# Patient Record
Sex: Female | Born: 1990 | Race: White | Hispanic: No | Marital: Single | Smoking: Current every day smoker
Health system: Southern US, Community
[De-identification: ages and names within clinical notes are randomized; demographics above are authoritative.]

## PROBLEM LIST (undated history)

## (undated) ENCOUNTER — Inpatient Hospital Stay (HOSPITAL_COMMUNITY): Payer: Self-pay

## (undated) DIAGNOSIS — B9689 Other specified bacterial agents as the cause of diseases classified elsewhere: Secondary | ICD-10-CM

## (undated) DIAGNOSIS — D649 Anemia, unspecified: Secondary | ICD-10-CM

## (undated) DIAGNOSIS — Z789 Other specified health status: Secondary | ICD-10-CM

## (undated) DIAGNOSIS — I10 Essential (primary) hypertension: Secondary | ICD-10-CM

## (undated) DIAGNOSIS — O219 Vomiting of pregnancy, unspecified: Secondary | ICD-10-CM

## (undated) DIAGNOSIS — F419 Anxiety disorder, unspecified: Secondary | ICD-10-CM

## (undated) DIAGNOSIS — R519 Headache, unspecified: Secondary | ICD-10-CM

## (undated) DIAGNOSIS — R51 Headache: Secondary | ICD-10-CM

## (undated) DIAGNOSIS — O139 Gestational [pregnancy-induced] hypertension without significant proteinuria, unspecified trimester: Secondary | ICD-10-CM

## (undated) DIAGNOSIS — N760 Acute vaginitis: Secondary | ICD-10-CM

## (undated) HISTORY — PX: NO PAST SURGERIES: SHX2092

## (undated) HISTORY — DX: Vomiting of pregnancy, unspecified: O21.9

---
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
## 2011-10-06 ENCOUNTER — Emergency Department (HOSPITAL_COMMUNITY)
Admission: EM | Admit: 2011-10-06 | Discharge: 2011-10-06 | Disposition: A | Discharge status: Home / Self Care | Payer: Self-pay | Attending: Emergency Medicine | Admitting: Emergency Medicine

## 2011-10-06 ENCOUNTER — Encounter (HOSPITAL_COMMUNITY): Payer: Self-pay

## 2011-10-06 ENCOUNTER — Emergency Department (HOSPITAL_COMMUNITY): Payer: Self-pay

## 2011-10-06 DIAGNOSIS — F17200 Nicotine dependence, unspecified, uncomplicated: Secondary | ICD-10-CM | POA: Insufficient documentation

## 2011-10-06 DIAGNOSIS — S86819A Strain of other muscle(s) and tendon(s) at lower leg level, unspecified leg, initial encounter: Secondary | ICD-10-CM | POA: Insufficient documentation

## 2011-10-06 DIAGNOSIS — M25569 Pain in unspecified knee: Secondary | ICD-10-CM | POA: Insufficient documentation

## 2011-10-06 DIAGNOSIS — X58XXXA Exposure to other specified factors, initial encounter: Secondary | ICD-10-CM | POA: Insufficient documentation

## 2011-10-06 DIAGNOSIS — S838X9A Sprain of other specified parts of unspecified knee, initial encounter: Secondary | ICD-10-CM | POA: Insufficient documentation

## 2011-10-06 DIAGNOSIS — S8392XA Sprain of unspecified site of left knee, initial encounter: Secondary | ICD-10-CM

## 2011-10-06 MED ORDER — IBUPROFEN 800 MG PO TABS
800.0000 mg | ORAL_TABLET | Freq: Once | ORAL | Status: AC
  Administered 2011-10-06: 800 mg via ORAL
  Filled 2011-10-06: qty 1

## 2011-10-06 NOTE — ED Notes (Signed)
Left knee pain for 2 months, pain worse today--was out dancing last night

## 2011-10-06 NOTE — ED Provider Notes (Signed)
History     CSN: [id number]  Arrival date & time 10/06/11  1323   First MD Initiated Contact with Patient 10/06/11 1349      Chief Complaint  Patient presents with  . Knee Pain    (Consider location/radiation/quality/duration/timing/severity/associated sxs/prior treatment) HPI Comments: Pt states her L knee has been hurting ~ 2 months.  She denies any injury and has no known problem with the knee.  She went dancing last PM and twisted the knee.  It is eeven more painful today than usual.  Patient is a 21 y.o. female presenting with knee pain. The history is provided by the patient. No language interpreter was used.  Knee Pain This is a new problem. The problem occurs constantly. Pertinent negatives include no joint swelling, numbness or weakness. The symptoms are aggravated by walking and standing. She has tried nothing for the symptoms.    History reviewed. No pertinent past medical history.  History reviewed. No pertinent past surgical history.  No family history on file.  History  Substance Use Topics  . Smoking status: Current Everyday Smoker    Types: Cigarettes  . Smokeless tobacco: Not on file  . Alcohol Use: No    OB History    Grav Para Term Preterm Abortions TAB SAB Ect Mult Living                  Review of Systems  Musculoskeletal: Negative for joint swelling and gait problem.  Skin: Negative for wound.  Neurological: Negative for weakness and numbness.  All other systems reviewed and are negative.    Allergies  Penicillins  Home Medications   Current Outpatient Rx  Name Route Sig Dispense Refill  . MEDROXYPROGESTERONE ACETATE 150 MG/ML IM SUSP Intramuscular Inject 150 mg into the muscle every 3 (three) months.    Marland Kitchen NAPROXEN SODIUM 220 MG PO TABS Oral Take 220 mg by mouth 2 (two) times daily as needed. Pain      BP 126/71  Pulse 87  Temp 98.3 F (36.8 C) (Oral)  Resp 20  Ht 5' 9.5" (1.765 m)  Wt 176 lb (79.833 kg)  BMI 25.62 kg/m2  SpO2  99%  Physical Exam  Nursing note and vitals reviewed. Constitutional: She is oriented to person, place, and time. She appears well-developed and well-nourished. No distress.  HENT:  Head: Normocephalic and atraumatic.  Eyes: EOM are normal.  Neck: Normal range of motion.  Cardiovascular: Normal rate, regular rhythm and normal heart sounds.   Pulmonary/Chest: Effort normal and breath sounds normal.  Abdominal: Soft. She exhibits no distension. There is no tenderness.  Musculoskeletal: She exhibits tenderness.       Left knee: She exhibits decreased range of motion. She exhibits no swelling, no effusion, no ecchymosis, no deformity, no laceration, no erythema, normal alignment and no LCL laxity. tenderness found. Lateral joint line tenderness noted.       Pain with passive movement of patella.  Lateral joint line pain with both valgus and varus stress.  Anterior drawer and lachmann's tests are negative.  Neurological: She is alert and oriented to person, place, and time.  Skin: Skin is warm and dry.  Psychiatric: She has a normal mood and affect. Judgment normal.    ED Course  Procedures (including critical care time)  Labs Reviewed - No data to display Dg Knee Complete 4 Views Left  10/06/2011  *RADIOLOGY REPORT*  Clinical Data: Twisting injury and knee.  Lateral knee pain.  LEFT KNEE - COMPLETE  4+ VIEW  Comparison:  None.  Findings:  There is no evidence of fracture, dislocation, or joint effusion.  There is no evidence of arthropathy or other focal bone abnormality.  Soft tissues are unremarkable.  IMPRESSION: Negative.  Original Report Authenticated By: Danae Orleans, M.D.     1. Left knee sprain       MDM  Knee immobilizer, crutches, ice Ibuprofen 800 mg TID F/u with dr. Hilda Lias prn         Evalina Field, PA 10/06/11 1459

## 2011-10-06 NOTE — ED Provider Notes (Signed)
Medical screening examination/treatment/procedure(s) were performed by non-physician practitioner and as supervising physician I was immediately available for consultation/collaboration.   Benny Lennert, MD 10/06/11 [phone]

## 2011-10-06 NOTE — ED Notes (Signed)
Patient with no complaints at this time. Respirations even and unlabored. Skin warm/dry. Discharge instructions reviewed with patient at this time. Patient given opportunity to voice concerns/ask questions. Patient discharged at this time and left Emergency Department with steady gait.   

## 2014-02-06 ENCOUNTER — Encounter (HOSPITAL_COMMUNITY): Payer: Self-pay | Admitting: Emergency Medicine

## 2014-02-06 ENCOUNTER — Emergency Department (HOSPITAL_COMMUNITY)
Admission: EM | Admit: 2014-02-06 | Discharge: 2014-02-06 | Disposition: A | Discharge status: Home / Self Care | Payer: Self-pay | Attending: Emergency Medicine | Admitting: Emergency Medicine

## 2014-02-06 DIAGNOSIS — Z720 Tobacco use: Secondary | ICD-10-CM | POA: Insufficient documentation

## 2014-02-06 DIAGNOSIS — Z880 Allergy status to penicillin: Secondary | ICD-10-CM | POA: Insufficient documentation

## 2014-02-06 DIAGNOSIS — Z3202 Encounter for pregnancy test, result negative: Secondary | ICD-10-CM | POA: Insufficient documentation

## 2014-02-06 DIAGNOSIS — Z79818 Long term (current) use of other agents affecting estrogen receptors and estrogen levels: Secondary | ICD-10-CM | POA: Insufficient documentation

## 2014-02-06 DIAGNOSIS — M545 Low back pain: Secondary | ICD-10-CM | POA: Insufficient documentation

## 2014-02-06 DIAGNOSIS — N939 Abnormal uterine and vaginal bleeding, unspecified: Secondary | ICD-10-CM | POA: Insufficient documentation

## 2014-02-06 LAB — URINALYSIS, ROUTINE W REFLEX MICROSCOPIC
Glucose, UA: NEGATIVE mg/dL
Ketones, ur: NEGATIVE mg/dL
Leukocytes, UA: NEGATIVE
NITRITE: NEGATIVE
PROTEIN: 30 mg/dL — AB
Specific Gravity, Urine: >1.03 — ABNORMAL HIGH (ref 1.005–1.030)
UROBILINOGEN UA: 1 mg/dL (ref 0.0–1.0)
pH: 6.5 (ref 5.0–8.0)

## 2014-02-06 LAB — POC URINE PREG, ED: PREG TEST UR: NEGATIVE

## 2014-02-06 LAB — URINE MICROSCOPIC-ADD ON

## 2014-02-06 MED ORDER — IBUPROFEN 800 MG PO TABS
800.0000 mg | ORAL_TABLET | Freq: Once | ORAL | Status: AC
  Administered 2014-02-06: 800 mg via ORAL
  Filled 2014-02-06: qty 1

## 2014-02-06 MED ORDER — IBUPROFEN 800 MG PO TABS: 800.0000 mg | ORAL_TABLET | Freq: Three times a day (TID) | ORAL | Status: DC

## 2014-02-06 NOTE — ED Provider Notes (Signed)
CSN: [id number]     Arrival date & time 02/06/14  1704 History  This patient was seen in room APFT24/APFT24 and the patient's care was started at 5:43 PM.    Chief Complaint  Patient presents with  . Vaginal Bleeding     HPI   HPI Comments: Dana Lam is a 23 y.o. female who presents to the Emergency Department complaining of vaginal bleeding that began this morning.  She states that she is approximately two weeks late on her period and she is concerned that shy may be pregnant.  She reports starting her menstrual period this morning with a lighter than normal period today and mainly just "spotting".  She also reports "aching" pain to her lower back.  She denies any abdominal pain, fever, vomiting, dysuria, flank pain, or vaginal discharge.  She states she has taken several home pregnancies tests which have all been negative.     History reviewed. No pertinent past medical history. History reviewed. No pertinent past surgical history. Family History  Problem Relation Age of Onset  . Hypertension Mother   . Cancer Other   . Heart attack Maternal Grandfather   . Heart attack Maternal Uncle    History  Substance Use Topics  . Smoking status: Current Every Day Smoker -- 0.07 packs/day for 5 years    Types: Cigarettes  . Smokeless tobacco: Never Used  . Alcohol Use: No     Comment: occasional   OB History    No data available     Review of Systems  Constitutional: Negative for fever, chills, activity change and appetite change.  Respiratory: Negative for cough, chest tightness and shortness of breath.   Cardiovascular: Negative for chest pain.  Gastrointestinal: Negative for nausea, vomiting and abdominal pain.  Genitourinary: Positive for vaginal bleeding and menstrual problem. Negative for dysuria, frequency, hematuria, flank pain, vaginal discharge, difficulty urinating and vaginal pain.  Musculoskeletal: Positive for back pain.  Skin: Negative for color change and  rash.  Neurological: Negative for dizziness, weakness and numbness.  Hematological: Negative for adenopathy.  All other systems reviewed and are negative.     Allergies  Penicillins  Home Medications   Prior to Admission medications   Medication Sig Start Date End Date Taking? Authorizing Provider  medroxyPROGESTERone (DEPO-PROVERA) 150 MG/ML injection Inject 150 mg into the muscle every 3 (three) months.    Historical Provider, MD  naproxen sodium (ALEVE) 220 MG tablet Take 220 mg by mouth 2 (two) times daily as needed. Pain    Historical Provider, MD   BP 131/70 mmHg  Pulse 94  Temp(Src) 97.9 F (36.6 C) (Oral)  Resp 16  Ht 5\' 10"  (1.778 m)  Wt 178 lb 9.6 oz (81.012 kg)  BMI 25.63 kg/m2  SpO2 100%  LMP 01/01/2014   Physical Exam  Constitutional: She is oriented to person, place, and time. She appears well-developed and well-nourished.  HENT:  Head: Normocephalic and atraumatic.  Cardiovascular: Normal rate, regular rhythm, normal heart sounds and intact distal pulses.   No murmur heard. Pulmonary/Chest: Effort normal. No respiratory distress.  Abdominal: Soft. She exhibits no distension and no mass. There is no tenderness. There is no rebound and no guarding.  Musculoskeletal: Normal range of motion.  Neurological: She is alert and oriented to person, place, and time. She exhibits normal muscle tone. Coordination normal.  Skin: Skin is warm and dry.  Psychiatric: She has a normal mood and affect.  Nursing note and vitals reviewed.   ED  Course  Procedures (including critical care time)  DIAGNOSTIC STUDIES: Oxygen Saturation is 100% on RA, normal by my interpretation.    COORDINATION OF CARE: 5:43 PM Discussed treatment plan with pt at bedside and pt agreed to plan.   Labs Review Labs Reviewed  URINALYSIS, ROUTINE W REFLEX MICROSCOPIC - Abnormal; Notable for the following:    Color, Urine AMBER (*)    APPearance TURBID (*)    Specific Gravity, Urine >1.030  (*)    Hgb urine dipstick LARGE (*)    Bilirubin Urine SMALL (*)    Protein, ur 30 (*)    All other components within normal limits  URINE MICROSCOPIC-ADD ON  POC URINE PREG, ED    Imaging Review No results found.   EKG Interpretation None      MDM   Final diagnoses:  Vaginal bleeding    Pt is well appearing.  Ambulates with steady gait.  No abdominal pain on exam, no CVA tenderness.  Urine preg is negative.  [facility], appears stable for d/c and agrees to return here for any worsening symptoms      Jeanine Caven L. Trisha Mangleriplett, PA-C 02/07/14 0015  Donnetta HutchingBrian Cook, MD 02/10/14 2136

## 2014-02-06 NOTE — ED Notes (Signed)
Patient reports vaginal bleeding that started today. Denies any pain. Per patient bleeding is heavier then spotting but not like a normal period. Patient reports period is two weeks late.

## 2014-04-05 ENCOUNTER — Other Ambulatory Visit (INDEPENDENT_AMBULATORY_CARE_PROVIDER_SITE_OTHER): Payer: Medicaid Other

## 2014-04-05 ENCOUNTER — Encounter: Payer: Self-pay | Admitting: Advanced Practice Midwife

## 2014-04-05 ENCOUNTER — Ambulatory Visit (INDEPENDENT_AMBULATORY_CARE_PROVIDER_SITE_OTHER): Payer: Medicaid Other | Admitting: Advanced Practice Midwife

## 2014-04-05 ENCOUNTER — Other Ambulatory Visit: Payer: Self-pay | Admitting: Women's Health

## 2014-04-05 ENCOUNTER — Ambulatory Visit: Payer: Self-pay | Admitting: Women's Health

## 2014-04-05 DIAGNOSIS — Z331 Pregnant state, incidental: Secondary | ICD-10-CM

## 2014-04-05 DIAGNOSIS — O4691 Antepartum hemorrhage, unspecified, first trimester: Secondary | ICD-10-CM

## 2014-04-05 DIAGNOSIS — O209 Hemorrhage in early pregnancy, unspecified: Secondary | ICD-10-CM

## 2014-04-05 DIAGNOSIS — Z1389 Encounter for screening for other disorder: Secondary | ICD-10-CM

## 2014-04-05 DIAGNOSIS — O200 Threatened abortion: Secondary | ICD-10-CM | POA: Insufficient documentation

## 2014-04-05 LAB — POCT URINALYSIS DIPSTICK
Glucose, UA: NEGATIVE
KETONES UA: NEGATIVE
Leukocytes, UA: NEGATIVE
Nitrite, UA: NEGATIVE
PROTEIN UA: NEGATIVE
RBC UA: 3

## 2014-04-05 NOTE — Progress Notes (Signed)
Abdominal & Vaginal U/S performed-single Intrauterine GS noted within lower uterine segment with ?YS noted within, GS has an irregular shape, cx appears closed, bilateral adnexa appears WNL, GS meas c/w 6+0 wks no definite fetal pole noted within

## 2014-04-05 NOTE — Progress Notes (Signed)
[facility] Visit  Patient name: Dana LaineMeagan L Lam MRN [id number]  Date of birth: 03/04/1991  CC & HPI:  Dana Lam is a 24 y.o. Caucasian female presenting today for vaginal bleeding.  She had an ED visit 11/15 for VB that started that day (with a negative UPT), so sure LMP would place her at 8.2 weeks today. She started spotting yesterday and is still spotting now.   Pertinent History Reviewed:  Medical & Surgical Hx:   History reviewed. No pertinent past medical history. History reviewed. No pertinent past surgical history. Current outpatient prescriptions: naproxen sodium (ALEVE) 220 MG tablet, Take 220 mg by mouth 2 (two) times daily as needed. Pain, Disp: , Rfl:  Social History: Reviewed -  reports that she has been smoking Cigarettes.  She has a .35 pack-year smoking history. She has never used smokeless tobacco.  Objective Findings:  Vitals: LMP 02/06/2014   Abdominal & Vaginal U/S performed-single Intrauterine GS noted within lower uterine segment with ?YS noted within, GS has an irregular shape, cx appears closed, bilateral adnexa appears WNL, GS meas c/w 6+0 wks no definite fetal pole noted within  Results for orders placed or performed in visit on 04/05/14 (from the past 24 hour(s))  POCT urinalysis dipstick   Collection Time: 04/05/14  1:31 PM  Result Value Ref Range   Color, UA yellow    Clarity, UA clear    Glucose, UA neg    Bilirubin, UA     Ketones, UA neg    Spec Grav, UA     Blood, UA 3    pH, UA     Protein, UA neg    Urobilinogen, UA     Nitrite, UA neg    Leukocytes, UA Negative      Assessment & Plan:  A:   Threatend ab vs early IUP P:  ABO/Qhcg/CBC now   F/U Thursday for Qhcg   CRESENZO-DISHMAN,Esli Jernigan CNM 04/05/2014 1:33 PM

## 2014-04-06 LAB — ABO AND RH: Rh Type: POSITIVE

## 2014-04-06 LAB — CBC
HCT: 42.2 % (ref 36.0–46.0)
HEMOGLOBIN: 14.5 g/dL (ref 12.0–15.0)
MCH: 30.5 pg (ref 26.0–34.0)
MCHC: 34.4 g/dL (ref 30.0–36.0)
MCV: 88.7 fL (ref 78.0–100.0)
MPV: 10.5 fL (ref 8.6–12.4)
Platelets: 307 10*3/uL (ref 150–400)
RBC: 4.76 MIL/uL (ref 3.87–5.11)
RDW: 13.8 % (ref 11.5–15.5)
WBC: 10.9 10*3/uL — ABNORMAL HIGH (ref 4.0–10.5)

## 2014-04-06 LAB — HCG, QUANTITATIVE, PREGNANCY: hCG, Beta Chain, Quant, S: 2968.7 m[IU]/mL

## 2014-04-07 ENCOUNTER — Other Ambulatory Visit: Payer: Self-pay

## 2014-04-07 ENCOUNTER — Encounter: Payer: Self-pay | Admitting: Advanced Practice Midwife

## 2014-04-07 DIAGNOSIS — O200 Threatened abortion: Secondary | ICD-10-CM

## 2014-04-07 LAB — HCG, QUANTITATIVE, PREGNANCY: hCG, Beta Chain, Quant, S: 859 m[IU]/mL

## 2014-04-07 NOTE — Progress Notes (Signed)
  Qhcg dropped from 2968 to 859 in 2 days.  Pt still just spotting.  Discussed expectant management vs cytotec.  Pt will let us know what she wants to do.

## 2014-04-08 ENCOUNTER — Other Ambulatory Visit: Payer: Self-pay | Admitting: Obstetrics & Gynecology

## 2014-04-08 DIAGNOSIS — O3680X0 Pregnancy with inconclusive fetal viability, not applicable or unspecified: Secondary | ICD-10-CM

## 2014-04-12 ENCOUNTER — Other Ambulatory Visit: Payer: Self-pay

## 2015-02-08 ENCOUNTER — Encounter (HOSPITAL_COMMUNITY): Payer: Self-pay | Admitting: *Deleted

## 2015-03-26 NOTE — L&D Delivery Note (Signed)
Patient is 25 y.o. G2P0010 [redacted]w[redacted]d admitted IOL for gHTN  Delivery Note At 9:16 AM a viable female was delivered via Vaginal, Vacuum (Extractor) (Presentation: cephalic, LOA).  APGAR: 7, 8; weight pending  Placenta status: intact. Cord: 3vessel  Without complications  Anesthesia:  epidural Episiotomy: yes Lacerations: 2nd degree;Perineal Suture Repair: 2.0 3.0 vicryl Est. Blood Loss (mL): 300  Mom to postpartum.  Baby to Couplet care / Skin to Skin.  Leland HerElsia J Yoo 12/13/2015, 10:01 AM      Upon arrival patient was complete and pushing. She pushed with poor maternal effort so a vacuum (extractor) was used to aid maternal effort to deliver a healthy baby boy. Baby delivered without difficulty, was noted to have good tone and place on maternal abdomen for oral suctioning, drying and stimulation. Delayed cord clamping performed. Placenta delivered intact with 3V cord. Vaginal canal and perineum was inspected and repaired; hemostatic. Pitocin was started and uterus massaged until bleeding slowed. Counts of sharps, instruments, and lap pads were all correct.   Leland HerElsia J Yoo, DO PGY-1 9/20/201710:01 AM

## 2015-05-02 ENCOUNTER — Other Ambulatory Visit: Payer: Self-pay | Admitting: Obstetrics & Gynecology

## 2015-05-02 DIAGNOSIS — O3680X0 Pregnancy with inconclusive fetal viability, not applicable or unspecified: Secondary | ICD-10-CM

## 2015-05-03 ENCOUNTER — Ambulatory Visit (INDEPENDENT_AMBULATORY_CARE_PROVIDER_SITE_OTHER): Payer: Medicaid Other

## 2015-05-03 DIAGNOSIS — O3680X0 Pregnancy with inconclusive fetal viability, not applicable or unspecified: Secondary | ICD-10-CM | POA: Diagnosis not present

## 2015-05-03 DIAGNOSIS — Z3A01 Less than 8 weeks gestation of pregnancy: Secondary | ICD-10-CM

## 2015-05-03 NOTE — Progress Notes (Signed)
[place] 7+4wks,single IUP w/ys,pos fht 141 bpm,normal ov's bilat,crl 9.32mm

## 2015-05-11 ENCOUNTER — Ambulatory Visit (INDEPENDENT_AMBULATORY_CARE_PROVIDER_SITE_OTHER): Payer: Medicaid Other | Admitting: Adult Health

## 2015-05-11 ENCOUNTER — Other Ambulatory Visit (HOSPITAL_COMMUNITY)
Admission: RE | Admit: 2015-05-11 | Discharge: 2015-05-11 | Disposition: A | Discharge status: Home / Self Care | Payer: Medicaid Other | Source: Ambulatory Visit | Attending: Adult Health | Admitting: Adult Health

## 2015-05-11 ENCOUNTER — Encounter: Payer: Self-pay | Admitting: Adult Health

## 2015-05-11 VITALS — BP 130/72 | HR 98 | Wt 172.5 lb

## 2015-05-11 DIAGNOSIS — O219 Vomiting of pregnancy, unspecified: Secondary | ICD-10-CM

## 2015-05-11 DIAGNOSIS — Z3682 Encounter for antenatal screening for nuchal translucency: Secondary | ICD-10-CM

## 2015-05-11 DIAGNOSIS — Z3491 Encounter for supervision of normal pregnancy, unspecified, first trimester: Secondary | ICD-10-CM

## 2015-05-11 DIAGNOSIS — Z113 Encounter for screening for infections with a predominantly sexual mode of transmission: Secondary | ICD-10-CM | POA: Diagnosis present

## 2015-05-11 DIAGNOSIS — Z1389 Encounter for screening for other disorder: Secondary | ICD-10-CM

## 2015-05-11 DIAGNOSIS — Z331 Pregnant state, incidental: Secondary | ICD-10-CM

## 2015-05-11 DIAGNOSIS — Z01419 Encounter for gynecological examination (general) (routine) without abnormal findings: Secondary | ICD-10-CM | POA: Diagnosis present

## 2015-05-11 DIAGNOSIS — Z369 Encounter for antenatal screening, unspecified: Secondary | ICD-10-CM

## 2015-05-11 DIAGNOSIS — Z3481 Encounter for supervision of other normal pregnancy, first trimester: Secondary | ICD-10-CM | POA: Diagnosis not present

## 2015-05-11 DIAGNOSIS — Z3490 Encounter for supervision of normal pregnancy, unspecified, unspecified trimester: Secondary | ICD-10-CM | POA: Insufficient documentation

## 2015-05-11 DIAGNOSIS — Z0283 Encounter for blood-alcohol and blood-drug test: Secondary | ICD-10-CM

## 2015-05-11 HISTORY — DX: Vomiting of pregnancy, unspecified: O21.9

## 2015-05-11 LAB — POCT URINALYSIS DIPSTICK
Blood, UA: NEGATIVE
Glucose, UA: NEGATIVE
KETONES UA: NEGATIVE
Nitrite, UA: NEGATIVE

## 2015-05-11 MED ORDER — DOXYLAMINE-PYRIDOXINE 10-10 MG PO TBEC: DELAYED_RELEASE_TABLET | ORAL | Status: DC

## 2015-05-11 NOTE — Patient Instructions (Signed)
First Trimester of Pregnancy The first trimester of pregnancy is from week 1 until the end of week 12 (months 1 through 3). A week after a sperm fertilizes an egg, the egg will implant on the wall of the uterus. This embryo will begin to develop into a baby. Genes from you and your partner are forming the baby. The female genes determine whether the baby is a boy or a girl. At 6-8 weeks, the eyes and face are formed, and the heartbeat can be seen on ultrasound. At the end of 12 weeks, all the baby's organs are formed.  Now that you are pregnant, you will want to do everything you can to have a healthy baby. Two of the most important things are to get good prenatal care and to follow your health care provider's instructions. Prenatal care is all the medical care you receive before the baby's birth. This care will help prevent, find, and treat any problems during the pregnancy and childbirth. BODY CHANGES Your body goes through many changes during pregnancy. The changes vary from woman to woman.   You may gain or lose a couple of pounds at first.  You may feel sick to your stomach (nauseous) and throw up (vomit). If the vomiting is uncontrollable, call your health care provider.  You may tire easily.  You may develop headaches that can be relieved by medicines approved by your health care provider.  You may urinate more often. Painful urination may mean you have a bladder infection.  You may develop heartburn as a result of your pregnancy.  You may develop constipation because certain hormones are causing the muscles that push waste through your intestines to slow down.  You may develop hemorrhoids or swollen, bulging veins (varicose veins).  Your breasts may begin to grow larger and become tender. Your nipples may stick out more, and the tissue that surrounds them (areola) may become darker.  Your gums may bleed and may be sensitive to brushing and flossing.  Dark spots or blotches (chloasma,  mask of pregnancy) may develop on your face. This will likely fade after the baby is born.  Your menstrual periods will stop.  You may have a loss of appetite.  You may develop cravings for certain kinds of food.  You may have changes in your emotions from day to day, such as being excited to be pregnant or being concerned that something may go wrong with the pregnancy and baby.  You may have more vivid and strange dreams.  You may have changes in your hair. These can include thickening of your hair, rapid growth, and changes in texture. Some women also have hair loss during or after pregnancy, or hair that feels dry or thin. Your hair will most likely return to normal after your baby is born. WHAT TO EXPECT AT YOUR PRENATAL VISITS During a routine prenatal visit:  You will be weighed to make sure you and the baby are growing normally.  Your blood pressure will be taken.  Your abdomen will be measured to track your baby's growth.  The fetal heartbeat will be listened to starting around week 10 or 12 of your pregnancy.  Test results from any previous visits will be discussed. Your health care provider may ask you:  How you are feeling.  If you are feeling the baby move.  If you have had any abnormal symptoms, such as leaking fluid, bleeding, severe headaches, or abdominal cramping.  If you are using any tobacco products,   including cigarettes, chewing tobacco, and electronic cigarettes.  If you have any questions. Other tests that may be performed during your first trimester include:  Blood tests to find your blood type and to check for the presence of any previous infections. They will also be used to check for low iron levels (anemia) and Rh antibodies. Later in the pregnancy, blood tests for diabetes will be done along with other tests if problems develop.  Urine tests to check for infections, diabetes, or protein in the urine.  An ultrasound to confirm the proper growth  and development of the baby.  An amniocentesis to check for possible genetic problems.  Fetal screens for spina bifida and Down syndrome.  You may need other tests to make sure you and the baby are doing well.  HIV (human immunodeficiency virus) testing. Routine prenatal testing includes screening for HIV, unless you choose not to have this test. HOME CARE INSTRUCTIONS  Medicines  Follow your health care provider's instructions regarding medicine use. Specific medicines may be either safe or unsafe to take during pregnancy.  Take your prenatal vitamins as directed.  If you develop constipation, try taking a stool softener if your health care provider approves. Diet  Eat regular, well-balanced meals. Choose a variety of foods, such as meat or vegetable-based protein, fish, milk and low-fat dairy products, vegetables, fruits, and whole grain breads and cereals. Your health care provider will help you determine the amount of weight gain that is right for you.  Avoid raw meat and uncooked cheese. These carry germs that can cause birth defects in the baby.  Eating four or five small meals rather than three large meals a day may help relieve nausea and vomiting. If you start to feel nauseous, eating a few soda crackers can be helpful. Drinking liquids between meals instead of during meals also seems to help nausea and vomiting.  If you develop constipation, eat more high-fiber foods, such as fresh vegetables or fruit and whole grains. Drink enough fluids to keep your urine clear or pale yellow. Activity and Exercise  Exercise only as directed by your health care provider. Exercising will help you:  Control your weight.  Stay in shape.  Be prepared for labor and delivery.  Experiencing pain or cramping in the lower abdomen or low back is a good sign that you should stop exercising. Check with your health care provider before continuing normal exercises.  Try to avoid standing for long  periods of time. Move your legs often if you must stand in one place for a long time.  Avoid heavy lifting.  Wear low-heeled shoes, and practice good posture.  You may continue to have sex unless your health care provider directs you otherwise. Relief of Pain or Discomfort  Wear a good support bra for breast tenderness.   Take warm sitz baths to soothe any pain or discomfort caused by hemorrhoids. Use hemorrhoid cream if your health care provider approves.   Rest with your legs elevated if you have leg cramps or low back pain.  If you develop varicose veins in your legs, wear support hose. Elevate your feet for 15 minutes, 3-4 times a day. Limit salt in your diet. Prenatal Care  Schedule your prenatal visits by the twelfth week of pregnancy. They are usually scheduled monthly at first, then more often in the last 2 months before delivery.  Write down your questions. Take them to your prenatal visits.  Keep all your prenatal visits as directed by your   health care provider. Safety  Wear your seat belt at all times when driving.  Make a list of emergency phone numbers, including numbers for family, friends, the hospital, and police and fire departments. General Tips  Ask your health care provider for a referral to a local prenatal education class. Begin classes no later than at the beginning of month 6 of your pregnancy.  Ask for help if you have counseling or nutritional needs during pregnancy. Your health care provider can offer advice or refer you to specialists for help with various needs.  Do not use hot tubs, steam rooms, or saunas.  Do not douche or use tampons or scented sanitary pads.  Do not cross your legs for long periods of time.  Avoid cat litter boxes and soil used by cats. These carry germs that can cause birth defects in the baby and possibly loss of the fetus by miscarriage or stillbirth.  Avoid all smoking, herbs, alcohol, and medicines not prescribed by  your health care provider. Chemicals in these affect the formation and growth of the baby.  Do not use any tobacco products, including cigarettes, chewing tobacco, and electronic cigarettes. If you need help quitting, ask your health care provider. You may receive counseling support and other resources to help you quit.  Schedule a dentist appointment. At home, brush your teeth with a soft toothbrush and be gentle when you floss. SEEK MEDICAL CARE IF:   You have dizziness.  You have mild pelvic cramps, pelvic pressure, or nagging pain in the abdominal area.  You have persistent nausea, vomiting, or diarrhea.  You have a bad smelling vaginal discharge.  You have pain with urination.  You notice increased swelling in your face, hands, legs, or ankles. SEEK IMMEDIATE MEDICAL CARE IF:   You have a fever.  You are leaking fluid from your vagina.  You have spotting or bleeding from your vagina.  You have severe abdominal cramping or pain.  You have rapid weight gain or loss.  You vomit blood or material that looks like coffee grounds.  You are exposed to [place] measles and have never had them.  You are exposed to fifth disease or chickenpox.  You develop a severe headache.  You have shortness of breath.  You have any kind of trauma, such as from a fall or a car accident.   This information is not intended to replace advice given to you by your health care provider. Make sure you discuss any questions you have with your health care provider.   Document Released: 03/05/2001 Document Revised: 04/01/2014 Document Reviewed: 01/19/2013 Elsevier Interactive Patient Education [employer] Return in 4 weeks for IT/NT and see Drenda Freeze Eat every 2 hours, gum and hard candies try diclegis

## 2015-05-11 NOTE — Progress Notes (Signed)
Subjective:  Dana Lam is a 25 y.o. G34P0010 Caucasian female at [redacted]w[redacted]d by LMP and [place] being seen today for her first obstetrical visit.  Her obstetrical history is significant for smoker.Had miscarriage in 2016.  Pregnancy history fully reviewed.  Patient reports nausea, and some vomiting.  Denies vb, cramping, uti s/s, abnormal/malodorous vag d/c, or vulvovaginal itching/irritation.  BP 130/72 mmHg  Pulse 98  Wt 172 lb 8 oz (78.245 kg)  LMP 03/11/2015 (Exact Date)  HISTORY: OB History  Gravida Para Term Preterm AB SAB TAB Ectopic Multiple Living  0    # Outcome Date GA Lbr Len/2nd Weight Sex Delivery Anes PTL Lv  2 Current           1 SAB              History reviewed. No pertinent past medical history. History reviewed. No pertinent past surgical history. Family History  Problem Relation Age of Onset  . Hypertension Mother   . Heart attack Maternal Grandfather   . Heart attack Maternal Uncle   . Cancer Paternal Grandmother     stomach  . Cancer Paternal Grandfather     lung  . Bipolar disorder Brother   . Sleep apnea Brother   . Heart disease Maternal Grandmother   . Other Maternal Grandmother     blood clot  . Arthritis Maternal Grandmother   . Thyroid disease Maternal Grandmother     Exam   System:     General: Well developed & nourished, no acute distress   Skin: Warm & dry, normal coloration and turgor, no rashes   Neurologic: Alert & oriented, normal mood   Cardiovascular: Regular rate & rhythm   Respiratory: Effort & rate normal, LCTAB, acyanotic   Abdomen: Soft, non tender   Extremities: normal strength, tone   Pelvic Exam:    Perineum: Normal perineum   Vulva: Normal, no lesions   Vagina:  Normal mucosa, normal discharge   Cervix: Normal, bulbous, appears closed   Uterus: Normal size/shape/contour for GA   Thin prep pap smear with GC/CHL FHR: 173  via [place]   Assessment:   Pregnancy: G2P0010 Patient Active Problem List   Diagnosis Date Noted  . Supervision of normal pregnancy in first trimester 05/11/2015  . Threatened abortion in early pregnancy 04/05/2014    [redacted]w[redacted]d G2P0010 New OB visit     Plan:  Initial labs drawn Continue prenatal vitamins Problem list reviewed and updated Reviewed n/v relief measures and warning s/s to report Reviewed recommended weight gain based on pre-gravid BMI Encouraged well-balanced diet Genetic Screening discussed Integrated Screen: requested Cystic fibrosis screening discussed requested Ultrasound discussed; fetal survey: requested Follow up in 4 weeks for IT/NT and see Drenda Freeze Rx Diclegis 10 mg, #36 given as trial lot [id number], exp 11/22/16  Adline Potter, NP 05/11/2015 2:42 PM

## 2015-05-13 LAB — URINE CULTURE: Organism ID, Bacteria: NO GROWTH

## 2015-05-15 LAB — CYTOLOGY - PAP

## 2015-05-19 LAB — CBC
HEMOGLOBIN: 14.5 g/dL (ref 11.1–15.9)
Hematocrit: 42.5 % (ref 34.0–46.6)
MCH: 31.2 pg (ref 26.6–33.0)
MCHC: 34.1 g/dL (ref 31.5–35.7)
MCV: 91 fL (ref 79–97)
Platelets: 259 10*3/uL (ref 150–379)
RBC: 4.65 x10E6/uL (ref 3.77–5.28)
RDW: 12.8 % (ref 12.3–15.4)
WBC: 8.8 10*3/uL (ref 3.4–10.8)

## 2015-05-19 LAB — URINALYSIS, ROUTINE W REFLEX MICROSCOPIC
BILIRUBIN UA: NEGATIVE
Glucose, UA: NEGATIVE
KETONES UA: NEGATIVE
Nitrite, UA: NEGATIVE
RBC UA: NEGATIVE
SPEC GRAV UA: 1.021 (ref 1.005–1.030)
Urobilinogen, Ur: 1 mg/dL (ref 0.2–1.0)
pH, UA: 7.5 (ref 5.0–7.5)

## 2015-05-19 LAB — MICROSCOPIC EXAMINATION: CASTS: NONE SEEN /LPF

## 2015-05-19 LAB — PMP SCREEN PROFILE (10S), URINE
Amphetamine Screen, Ur: NEGATIVE ng/mL
BENZODIAZEPINE SCREEN, URINE: NEGATIVE ng/mL
Barbiturate Screen, Ur: NEGATIVE ng/mL
CANNABINOIDS UR QL SCN: NEGATIVE ng/mL
Cocaine(Metab.)Screen, Urine: NEGATIVE ng/mL
Creatinine(Crt), U: 182.5 mg/dL (ref 20.0–300.0)
Methadone Scn, Ur: NEGATIVE ng/mL
OXYCODONE+OXYMORPHONE UR QL SCN: NEGATIVE ng/mL
Opiate Scrn, Ur: NEGATIVE ng/mL
PCP Scrn, Ur: NEGATIVE ng/mL
PH UR, DRUG SCRN: 7.6 (ref 4.5–8.9)
Propoxyphene, Screen: NEGATIVE ng/mL

## 2015-05-19 LAB — CYSTIC FIBROSIS MUTATION 97: Interpretation: NOT DETECTED

## 2015-05-19 LAB — HEPATITIS B SURFACE ANTIGEN: HEP B S AG: NEGATIVE

## 2015-05-19 LAB — ABO/RH: Rh Factor: POSITIVE

## 2015-05-19 LAB — RUBELLA SCREEN: Rubella Antibodies, IGG: 4.33 index (ref >0.99)

## 2015-05-19 LAB — RPR: RPR: NONREACTIVE

## 2015-05-19 LAB — HIV ANTIBODY (ROUTINE TESTING W REFLEX): HIV Screen 4th Generation wRfx: NONREACTIVE

## 2015-05-19 LAB — ANTIBODY SCREEN: ANTIBODY SCREEN: NEGATIVE

## 2015-05-19 LAB — VARICELLA ZOSTER ANTIBODY, IGG: VARICELLA: 230 {index} (ref >165)

## 2015-05-23 ENCOUNTER — Telehealth: Payer: Self-pay | Admitting: Adult Health

## 2015-05-23 MED ORDER — PROMETHAZINE HCL 25 MG PO TABS: 25.0000 mg | ORAL_TABLET | Freq: Four times a day (QID) | ORAL | Status: DC | PRN

## 2015-05-23 NOTE — Telephone Encounter (Signed)
Spoke with pt. Pt was prescribed Diclegis but it's not helping. Pt states she can't keep much down. Drinking lots of water. Pt is urinating ok and her mouth is moist. Can you order different med? Thanks!! JSY

## 2015-05-23 NOTE — Telephone Encounter (Signed)
Pt called stating that she was givin medication for her nausea by Victorino Dike, but the medication is not working. Pt would like something else called in. Please contact pt

## 2015-05-23 NOTE — Telephone Encounter (Signed)
Will rx phenergan  

## 2015-06-08 ENCOUNTER — Ambulatory Visit (INDEPENDENT_AMBULATORY_CARE_PROVIDER_SITE_OTHER): Payer: Medicaid Other | Admitting: Advanced Practice Midwife

## 2015-06-08 ENCOUNTER — Ambulatory Visit (INDEPENDENT_AMBULATORY_CARE_PROVIDER_SITE_OTHER): Payer: Medicaid Other

## 2015-06-08 VITALS — BP 112/44 | HR 84 | Wt 169.0 lb

## 2015-06-08 DIAGNOSIS — Z3682 Encounter for antenatal screening for nuchal translucency: Secondary | ICD-10-CM

## 2015-06-08 DIAGNOSIS — Z36 Encounter for antenatal screening of mother: Secondary | ICD-10-CM

## 2015-06-08 DIAGNOSIS — Z3491 Encounter for supervision of normal pregnancy, unspecified, first trimester: Secondary | ICD-10-CM

## 2015-06-08 DIAGNOSIS — Z3481 Encounter for supervision of other normal pregnancy, first trimester: Secondary | ICD-10-CM

## 2015-06-08 DIAGNOSIS — Z1389 Encounter for screening for other disorder: Secondary | ICD-10-CM

## 2015-06-08 DIAGNOSIS — Z3A13 13 weeks gestation of pregnancy: Secondary | ICD-10-CM | POA: Diagnosis not present

## 2015-06-08 DIAGNOSIS — Z331 Pregnant state, incidental: Secondary | ICD-10-CM

## 2015-06-08 LAB — POCT URINALYSIS DIPSTICK
Blood, UA: NEGATIVE
Glucose, UA: NEGATIVE
KETONES UA: NEGATIVE
LEUKOCYTES UA: NEGATIVE
Nitrite, UA: NEGATIVE

## 2015-06-08 NOTE — Progress Notes (Signed)
US 12+5wks,measurement c/w dates,fhr 165 bpm,crl 62.0 mm,nt 2.532mm,nb present,ant pl

## 2015-06-08 NOTE — Progress Notes (Signed)
G2P0010 5238w5d Estimated Date of Delivery: 12/16/15  Last menstrual period 03/11/2015, unknown if currently breastfeeding.   BP weight and urine results all reviewed and noted.  Please refer to the obstetrical flow sheet for the fundal height and fetal heart rate documentation: NT/IT today: US 12+5wks,measurement c/w dates,fhr 165 bpm,crl 62.0 mm,nt 2.32mm,nb present,ant pl   Patient denies any bleeding and no rupture of membranes symptoms or regular contractions. Patient is without complaintaints. Appetite is getting better.  All questions were answered.  No orders of the defined types were placed in this encounter.    Plan:  Continued routine obstetrical care,   Return in about 4 weeks (around 07/06/2015) for 2nd IT, LROB.

## 2015-06-10 LAB — MATERNAL SCREEN, INTEGRATED #1
Crown Rump Length: 62 mm
GEST. AGE ON COLLECTION DATE: 12.6 wk
Maternal Age at EDD: 23.8 years
Nuchal Translucency (NT): 2.2 mm
Number of Fetuses: 1
PAPP-A VALUE: 733.3 ng/mL
WEIGHT: 169 [lb_av]

## 2015-07-06 ENCOUNTER — Ambulatory Visit (INDEPENDENT_AMBULATORY_CARE_PROVIDER_SITE_OTHER): Payer: Medicaid Other | Admitting: Obstetrics & Gynecology

## 2015-07-06 ENCOUNTER — Encounter: Payer: Self-pay | Admitting: Obstetrics & Gynecology

## 2015-07-06 VITALS — BP 102/60 | HR 68 | Wt 160.0 lb

## 2015-07-06 DIAGNOSIS — Z1389 Encounter for screening for other disorder: Secondary | ICD-10-CM

## 2015-07-06 DIAGNOSIS — Z3492 Encounter for supervision of normal pregnancy, unspecified, second trimester: Secondary | ICD-10-CM

## 2015-07-06 DIAGNOSIS — Z331 Pregnant state, incidental: Secondary | ICD-10-CM

## 2015-07-06 DIAGNOSIS — Z369 Encounter for antenatal screening, unspecified: Secondary | ICD-10-CM

## 2015-07-06 LAB — POCT URINALYSIS DIPSTICK
Blood, UA: NEGATIVE
Glucose, UA: NEGATIVE
KETONES UA: NEGATIVE
Leukocytes, UA: NEGATIVE
Nitrite, UA: NEGATIVE
PROTEIN UA: NEGATIVE

## 2015-07-06 NOTE — Progress Notes (Signed)
G2P0010 3214w5d Estimated Date of Delivery: 12/16/15  Blood pressure 102/60, pulse 68, weight 160 lb (72.576 kg), last menstrual period 03/11/2015, unknown if currently breastfeeding.   BP weight and urine results all reviewed and noted.  Please refer to the obstetrical flow sheet for the fundal height and fetal heart rate documentation:  Patient reports good fetal movement, denies any bleeding and no rupture of membranes symptoms or regular contractions. Patient is without complaints. All questions were answered.  Orders Placed This Encounter  Procedures  . Maternal Screen, Integrated #2  . POCT urinalysis dipstick    Plan:  Continued routine obstetrical care, 2nd IT  No Follow-up on file.

## 2015-07-08 LAB — MATERNAL SCREEN, INTEGRATED #2
ADSF: 1.35
AFP MARKER: 28.6 ng/mL
AFP MoM: 0.93
CROWN RUMP LENGTH: 62 mm
DIA MOM: 1.65
DIA VALUE: 267 pg/mL
ESTRIOL UNCONJUGATED: 1.2 ng/mL
GESTATIONAL AGE: 16.6 wk
Gest. Age on Collection Date: 12.6 weeks
HCG MOM: 1.35
Maternal Age at EDD: 23.8 years
NUCHAL TRANSLUCENCY (NT): 2.2 mm
NUCHAL TRANSLUCENCY MOM: 1.46
Number of Fetuses: 1
PAPP-A MoM: 0.86
PAPP-A Value: 733.3 ng/mL
TEST RESULTS: NEGATIVE
Weight: 169 [lb_av]
Weight: 169 [lb_av]
hCG Value: 39.7 IU/mL

## 2015-07-17 ENCOUNTER — Other Ambulatory Visit: Payer: Self-pay | Admitting: Obstetrics & Gynecology

## 2015-07-17 DIAGNOSIS — Z1389 Encounter for screening for other disorder: Secondary | ICD-10-CM

## 2015-07-27 ENCOUNTER — Ambulatory Visit (INDEPENDENT_AMBULATORY_CARE_PROVIDER_SITE_OTHER): Payer: Medicaid Other

## 2015-07-27 ENCOUNTER — Ambulatory Visit (INDEPENDENT_AMBULATORY_CARE_PROVIDER_SITE_OTHER): Payer: Medicaid Other | Admitting: Advanced Practice Midwife

## 2015-07-27 VITALS — BP 102/60 | HR 86 | Wt 168.0 lb

## 2015-07-27 DIAGNOSIS — Z3491 Encounter for supervision of normal pregnancy, unspecified, first trimester: Secondary | ICD-10-CM

## 2015-07-27 DIAGNOSIS — Z3481 Encounter for supervision of other normal pregnancy, first trimester: Secondary | ICD-10-CM

## 2015-07-27 DIAGNOSIS — Z3A20 20 weeks gestation of pregnancy: Secondary | ICD-10-CM | POA: Diagnosis not present

## 2015-07-27 DIAGNOSIS — Z1389 Encounter for screening for other disorder: Secondary | ICD-10-CM

## 2015-07-27 DIAGNOSIS — Z331 Pregnant state, incidental: Secondary | ICD-10-CM

## 2015-07-27 DIAGNOSIS — Z36 Encounter for antenatal screening of mother: Secondary | ICD-10-CM

## 2015-07-27 LAB — POCT URINALYSIS DIPSTICK
Blood, UA: NEGATIVE
GLUCOSE UA: NEGATIVE
Ketones, UA: NEGATIVE
Leukocytes, UA: NEGATIVE
Nitrite, UA: NEGATIVE

## 2015-07-27 NOTE — Progress Notes (Signed)
US 19+5 wks,cx 3.2cm,breech,ant pl fr 0, normal ov's bilat,svp of fluid 5.2 cm,fhr 149 bpm,efw 321 g,limited view of spine because of fetal pos,please have pt come back for additional images or after appt with Fran.,no obvious abnormalities seen

## 2015-07-27 NOTE — Progress Notes (Signed)
G2P0010 8732w5d Estimated Date of Delivery: 12/16/15  Blood pressure 102/60, pulse 86, weight 168 lb (76.204 kg), last menstrual period 03/11/2015, unknown if currently breastfeeding.   BP weight and urine results all reviewed and noted.  Please refer to the obstetrical flow sheet for the fundal height and fetal heart rate documentation:US 19+5 wks,cx 3.2cm,breech,ant pl fr 0, normal ov's bilat,svp of fluid 5.2 cm,fhr 149 bpm,efw 321 g,limited view of spine because of fetal pos,please have pt come back for additional images or after appt with Fran.,no obvious abnormalities seen  Patient reports good fetal movement, denies any bleeding and no rupture of membranes symptoms or regular contractions. Patient is without complaints. All questions were answered.  Orders Placed This Encounter  Procedures  . POCT urinalysis dipstick    Plan:  Continued routine obstetrical care,   Return in about 4 weeks (around 08/24/2015) for LROB.

## 2015-08-24 ENCOUNTER — Encounter: Payer: Self-pay | Admitting: Advanced Practice Midwife

## 2015-08-24 ENCOUNTER — Ambulatory Visit (INDEPENDENT_AMBULATORY_CARE_PROVIDER_SITE_OTHER): Payer: Medicaid Other | Admitting: Advanced Practice Midwife

## 2015-08-24 VITALS — BP 110/60 | HR 80 | Wt 173.0 lb

## 2015-08-24 DIAGNOSIS — Z3482 Encounter for supervision of other normal pregnancy, second trimester: Secondary | ICD-10-CM

## 2015-08-24 DIAGNOSIS — Z363 Encounter for antenatal screening for malformations: Secondary | ICD-10-CM

## 2015-08-24 DIAGNOSIS — Z1389 Encounter for screening for other disorder: Secondary | ICD-10-CM

## 2015-08-24 DIAGNOSIS — Z3A24 24 weeks gestation of pregnancy: Secondary | ICD-10-CM

## 2015-08-24 DIAGNOSIS — Z331 Pregnant state, incidental: Secondary | ICD-10-CM

## 2015-08-24 LAB — POCT URINALYSIS DIPSTICK
GLUCOSE UA: NEGATIVE
KETONES UA: NEGATIVE
NITRITE UA: NEGATIVE
Protein, UA: NEGATIVE
RBC UA: NEGATIVE

## 2015-08-24 NOTE — Progress Notes (Signed)
G2P0010 8246w5d Estimated Date of Delivery: 12/16/15  Blood pressure 110/60, pulse 80, weight 173 lb (78.472 kg), last menstrual period 03/11/2015, unknown if currently breastfeeding.   BP weight and urine results all reviewed and noted.  Please refer to the obstetrical flow sheet for the fundal height and fetal heart rate documentation:  Patient reports good fetal movement, denies any bleeding and no rupture of membranes symptoms or regular contractions. Patient is without complaints other than numbness/tingling in finger All questions were answered.  Orders Placed This Encounter  Procedures  . US OB Follow Up  . POCT urinalysis dipstick    Plan:  Continued routine obstetrical care,   Return in about 4 weeks (around 09/21/2015) for PN2/LROB, US:OB F/U:.recheck spine

## 2015-08-24 NOTE — Patient Instructions (Addendum)
1. Before your test, do not eat or drink anything for 8-10 hours prior to your  appointment (a small amount of water is allowed and you may take any medicines you normally take). Be sure to drink lots of water the day before. 2. When you arrive, your blood will be drawn for a 'fasting' blood sugar level.  Then you will be given a sweetened carbonated beverage to drink. You should  complete drinking this beverage within five minutes. After finishing the  beverage, you will have your blood drawn exactly 1 and 2 hours later. Having  your blood drawn on time is an important part of this test. A total of three blood  samples will be done. 3. The test takes approximately 2  hours. During the test, do not have anything to  eat or drink. Do not smoke, chew gum (not even sugarless gum) or use breath mints.  4. During the test you should remain close by and seated as much as possible and  avoid walking around. You may want to bring a book or something else to  occupy your time.  5. After your test, you may eat and drink as normal. You may want to bring a snack  to eat after the test is finished. Your provider will advise you as to the results of  this test and any follow-up if necessary  If your sugar test is positive for gestational diabetes, you will be given an phone call and further instructions discussed. If you wish to know all of your test results before your next appointment, feel free to call the office, or look up your test results on Mychart.  (The range that the lab uses for normal values of the sugar test are not necessarily the range that is used for pregnant women; if your results are within the normal range, they are definitely normal.  However, if a value is deemed "high" by the lab, it may not be too high for a pregnant woman.  We will need to discuss the results if your value(s) fall in the "high" category).     Tdap Vaccine  It is recommended that you get the Tdap vaccine during the  third trimester of EACH pregnancy to help protect your baby from getting pertussis (whooping cough)  27-36 weeks is the BEST time to do this so that you can pass the protection on to your baby. During pregnancy is better than after pregnancy, but if you are unable to get it during pregnancy it will be offered at the hospital.  You can get this vaccine at the health department or your family doctor, as well as some pharmacies.  Everyone who will be around your baby should also be up-to-date on their vaccines. Adults (who are not pregnant) only need 1 dose of Tdap during adulthood.     Carpal Tunnel Syndrome Carpal tunnel syndrome is a condition that causes pain in your hand and arm. The carpal tunnel is a narrow area located on the palm side of your wrist. Repeated wrist motion or certain diseases may cause swelling within the tunnel. This swelling pinches the main nerve in the wrist (median nerve). CAUSES  This condition may be caused by:   Repeated wrist motions.  Wrist injuries.  Arthritis.  A cyst or tumor in the carpal tunnel.  Fluid buildup during pregnancy. Sometimes the cause of this condition is not known.  RISK FACTORS This condition is more likely to develop in:   People who  have jobs that cause them to repeatedly move their wrists in the same motion, such as butchers and cashiers.  Women.  People with certain conditions, such as:  Diabetes.  Obesity.  An underactive thyroid (hypothyroidism).  Kidney failure. SYMPTOMS  Symptoms of this condition include:   A tingling feeling in your fingers, especially in your thumb, index, and middle fingers.  Tingling or numbness in your hand.  An aching feeling in your entire arm, especially when your wrist and elbow are bent for long periods of time.  Wrist pain that goes up your arm to your shoulder.  Pain that goes down into your palm or fingers.  A weak feeling in your hands. You may have trouble grabbing and  holding items. Your symptoms may feel worse during the night.  DIAGNOSIS  This condition is diagnosed with a medical history and physical exam. You may also have tests, including:   An electromyogram (EMG). This test measures electrical signals sent by your nerves into the muscles.  X-rays. TREATMENT  Treatment for this condition includes:  Lifestyle changes. It is important to stop doing or modify the activity that caused your condition.  Physical or occupational therapy.  Medicines for pain and inflammation. This may include medicine that is injected into your wrist.  A wrist splint.  Surgery. HOME CARE INSTRUCTIONS  If You Have a Splint:  Wear it as told by your health care provider. Remove it only as told by your health care provider.  Loosen the splint if your fingers become numb and tingle, or if they turn cold and blue.  Keep the splint clean and dry. General Instructions  Take over-the-counter and prescription medicines only as told by your health care provider.  Rest your wrist from any activity that may be causing your pain. If your condition is work related, talk to your employer about changes that can be made, such as getting a wrist pad to use while typing.  If directed, apply ice to the painful area:  Put ice in a plastic bag.  Place a towel between your skin and the bag.  Leave the ice on for 20 minutes, 2-3 times per day.  Keep all follow-up visits as told by your health care provider. This is important.  Do any exercises as told by your health care provider, physical therapist, or occupational therapist. SEEK MEDICAL CARE IF:   You have new symptoms.  Your pain is not controlled with medicines.  Your symptoms get worse.   This information is not intended to replace advice given to you by your health care provider. Make sure you discuss any questions you have with your health care provider.   Document Released: 03/08/2000 Document Revised:  11/30/2014 Document Reviewed: 07/27/2014 Elsevier Interactive Patient Education [employer]2016 [employer] Inc.

## 2015-09-19 ENCOUNTER — Other Ambulatory Visit: Payer: Self-pay | Admitting: Advanced Practice Midwife

## 2015-09-19 DIAGNOSIS — IMO0002 Reserved for concepts with insufficient information to code with codable children: Secondary | ICD-10-CM

## 2015-09-19 DIAGNOSIS — Z0489 Encounter for examination and observation for other specified reasons: Secondary | ICD-10-CM

## 2015-09-21 ENCOUNTER — Encounter: Payer: Self-pay | Admitting: Obstetrics & Gynecology

## 2015-09-21 ENCOUNTER — Ambulatory Visit (INDEPENDENT_AMBULATORY_CARE_PROVIDER_SITE_OTHER): Payer: Medicaid Other

## 2015-09-21 ENCOUNTER — Other Ambulatory Visit: Payer: Medicaid Other

## 2015-09-21 ENCOUNTER — Ambulatory Visit (INDEPENDENT_AMBULATORY_CARE_PROVIDER_SITE_OTHER): Payer: Medicaid Other | Admitting: Obstetrics & Gynecology

## 2015-09-21 VITALS — BP 120/70 | HR 78 | Wt 179.0 lb

## 2015-09-21 DIAGNOSIS — Z3A28 28 weeks gestation of pregnancy: Secondary | ICD-10-CM

## 2015-09-21 DIAGNOSIS — Z36 Encounter for antenatal screening of mother: Secondary | ICD-10-CM | POA: Diagnosis not present

## 2015-09-21 DIAGNOSIS — Z369 Encounter for antenatal screening, unspecified: Secondary | ICD-10-CM

## 2015-09-21 DIAGNOSIS — Z1389 Encounter for screening for other disorder: Secondary | ICD-10-CM

## 2015-09-21 DIAGNOSIS — Z0489 Encounter for examination and observation for other specified reasons: Secondary | ICD-10-CM

## 2015-09-21 DIAGNOSIS — Z331 Pregnant state, incidental: Secondary | ICD-10-CM

## 2015-09-21 DIAGNOSIS — Z3491 Encounter for supervision of normal pregnancy, unspecified, first trimester: Secondary | ICD-10-CM

## 2015-09-21 DIAGNOSIS — Z3483 Encounter for supervision of other normal pregnancy, third trimester: Secondary | ICD-10-CM

## 2015-09-21 DIAGNOSIS — Z131 Encounter for screening for diabetes mellitus: Secondary | ICD-10-CM

## 2015-09-21 DIAGNOSIS — Z3492 Encounter for supervision of normal pregnancy, unspecified, second trimester: Secondary | ICD-10-CM

## 2015-09-21 DIAGNOSIS — IMO0002 Reserved for concepts with insufficient information to code with codable children: Secondary | ICD-10-CM

## 2015-09-21 LAB — POCT URINALYSIS DIPSTICK
Blood, UA: NEGATIVE
Glucose, UA: NEGATIVE
KETONES UA: NEGATIVE
LEUKOCYTES UA: NEGATIVE
NITRITE UA: NEGATIVE

## 2015-09-21 NOTE — Progress Notes (Signed)
G2P0010 9666w5d Estimated Date of Delivery: 12/16/15  Blood pressure 120/70, pulse 78, weight 179 lb (81.194 kg), last menstrual period 03/11/2015, unknown if currently breastfeeding.   BP weight and urine results all reviewed and noted.  Please refer to the obstetrical flow sheet for the fundal height and fetal heart rate documentation:  Patient reports good fetal movement, denies any bleeding and no rupture of membranes symptoms or regular contractions. Patient is without complaints. All questions were answered.  Orders Placed This Encounter  Procedures  . POCT urinalysis dipstick    Plan:  Continued routine obstetrical care, sonogram is normal excpt small amount of pyelectasis  No Follow-up on file.

## 2015-09-21 NOTE — Progress Notes (Signed)
US 27+5 wks,cephalic,cx 3.5cm,normal ov's bilat,ant pl gr 1,afi 11.7,anatomy of the spine complete,no obvious abnormalities,RK pyelectasis 6 mm,efw 1180 g 58 %

## 2015-09-22 LAB — CBC
HEMOGLOBIN: 13.3 g/dL (ref 11.1–15.9)
Hematocrit: 38.7 % (ref 34.0–46.6)
MCH: 31.9 pg (ref 26.6–33.0)
MCHC: 34.4 g/dL (ref 31.5–35.7)
MCV: 93 fL (ref 79–97)
PLATELETS: 283 10*3/uL (ref 150–379)
RBC: 4.17 x10E6/uL (ref 3.77–5.28)
RDW: 13.6 % (ref 12.3–15.4)
WBC: 13 10*3/uL — ABNORMAL HIGH (ref 3.4–10.8)

## 2015-09-22 LAB — GLUCOSE TOLERANCE, 2 HOURS W/ 1HR
GLUCOSE, 1 HOUR: 106 mg/dL (ref 65–179)
GLUCOSE, 2 HOUR: 75 mg/dL (ref 65–152)
Glucose, Fasting: 72 mg/dL (ref 65–91)

## 2015-09-22 LAB — RPR: RPR: NONREACTIVE

## 2015-09-22 LAB — ANTIBODY SCREEN: Antibody Screen: NEGATIVE

## 2015-09-22 LAB — HIV ANTIBODY (ROUTINE TESTING W REFLEX): HIV SCREEN 4TH GENERATION: NONREACTIVE

## 2015-10-12 ENCOUNTER — Encounter: Payer: Self-pay | Admitting: Obstetrics and Gynecology

## 2015-10-12 ENCOUNTER — Ambulatory Visit (INDEPENDENT_AMBULATORY_CARE_PROVIDER_SITE_OTHER): Payer: Medicaid Other | Admitting: Obstetrics and Gynecology

## 2015-10-12 VITALS — BP 120/70 | HR 90 | Wt 183.5 lb

## 2015-10-12 DIAGNOSIS — Z1389 Encounter for screening for other disorder: Secondary | ICD-10-CM

## 2015-10-12 DIAGNOSIS — Z3491 Encounter for supervision of normal pregnancy, unspecified, first trimester: Secondary | ICD-10-CM

## 2015-10-12 DIAGNOSIS — Z3481 Encounter for supervision of other normal pregnancy, first trimester: Secondary | ICD-10-CM

## 2015-10-12 DIAGNOSIS — Z331 Pregnant state, incidental: Secondary | ICD-10-CM

## 2015-10-12 LAB — POCT URINALYSIS DIPSTICK
Glucose, UA: NEGATIVE
KETONES UA: NEGATIVE
Leukocytes, UA: NEGATIVE
Nitrite, UA: NEGATIVE
Protein, UA: NEGATIVE
RBC UA: NEGATIVE

## 2015-10-12 NOTE — Patient Instructions (Signed)
(  [phone] is the phone number for Pregnancy Classes or hospital tours at [facility].  ° °You will be referred to  [email or web address]   for more information on childbirth classes   °At this site you may register for classes. You may sign up for a waiting list if classes are full. Please SIGN UP FOR THIS!.   When the waiting list becomes long, sometimes new classes can be added. ° ° ° °

## 2015-10-12 NOTE — Progress Notes (Signed)
Pt denies any problems or concerns at this time.  

## 2015-10-12 NOTE — Progress Notes (Signed)
G2P0010 2642w5d Estimated Date of Delivery: 12/16/15 LROB  Patient reports   good fetal movement, denies any bleeding and no rupture of membranes symptoms or regular contractions. Patient complaints:none , to attend classes  FOB supportive.  Blood pressure 120/70, pulse 90, weight 183 lb 8 oz (83.235 kg), last menstrual period 03/11/2015, unknown if currently breastfeeding.  refer to the ob flow sheet for FH and FHR, also BP, Wt, Urine results:notable for gluc protein                          Physical Examination: General appearance - alert, well appearing, and in no distress                                      Abdomen - FH 31 ,                                                         -FHR 138                                                                                               Pelvic -                                             Questions were answered. Assessment: LROB G2P0010 @ 6442w5d no c/o  Plan:  Continued routine obstetrical care, classes info given  F/u in  weeks for 2wk lrob  2

## 2015-10-17 ENCOUNTER — Encounter: Payer: Self-pay | Admitting: Women's Health

## 2015-10-17 ENCOUNTER — Inpatient Hospital Stay (HOSPITAL_COMMUNITY)
Admission: AD | Admit: 2015-10-17 | Discharge: 2015-10-17 | Disposition: A | Discharge status: Home / Self Care | Payer: Medicaid Other | Source: Ambulatory Visit | Attending: Obstetrics & Gynecology | Admitting: Obstetrics & Gynecology

## 2015-10-17 ENCOUNTER — Ambulatory Visit (INDEPENDENT_AMBULATORY_CARE_PROVIDER_SITE_OTHER): Payer: Medicaid Other | Admitting: Women's Health

## 2015-10-17 ENCOUNTER — Encounter (HOSPITAL_COMMUNITY): Payer: Self-pay

## 2015-10-17 VITALS — BP 134/64 | HR 72 | Wt 183.0 lb

## 2015-10-17 DIAGNOSIS — Z1389 Encounter for screening for other disorder: Secondary | ICD-10-CM

## 2015-10-17 DIAGNOSIS — N760 Acute vaginitis: Secondary | ICD-10-CM | POA: Diagnosis not present

## 2015-10-17 DIAGNOSIS — A499 Bacterial infection, unspecified: Secondary | ICD-10-CM | POA: Diagnosis not present

## 2015-10-17 DIAGNOSIS — Z331 Pregnant state, incidental: Secondary | ICD-10-CM | POA: Diagnosis not present

## 2015-10-17 DIAGNOSIS — Z3483 Encounter for supervision of other normal pregnancy, third trimester: Secondary | ICD-10-CM

## 2015-10-17 DIAGNOSIS — Z3A31 31 weeks gestation of pregnancy: Secondary | ICD-10-CM | POA: Diagnosis not present

## 2015-10-17 DIAGNOSIS — O99333 Smoking (tobacco) complicating pregnancy, third trimester: Secondary | ICD-10-CM | POA: Insufficient documentation

## 2015-10-17 DIAGNOSIS — Z3493 Encounter for supervision of normal pregnancy, unspecified, third trimester: Secondary | ICD-10-CM

## 2015-10-17 DIAGNOSIS — O4703 False labor before 37 completed weeks of gestation, third trimester: Secondary | ICD-10-CM | POA: Diagnosis not present

## 2015-10-17 DIAGNOSIS — B9689 Other specified bacterial agents as the cause of diseases classified elsewhere: Secondary | ICD-10-CM

## 2015-10-17 DIAGNOSIS — Z3A32 32 weeks gestation of pregnancy: Secondary | ICD-10-CM

## 2015-10-17 DIAGNOSIS — N898 Other specified noninflammatory disorders of vagina: Secondary | ICD-10-CM

## 2015-10-17 HISTORY — DX: Anemia, unspecified: D64.9

## 2015-10-17 HISTORY — DX: Acute vaginitis: B96.89

## 2015-10-17 HISTORY — DX: Acute vaginitis: N76.0

## 2015-10-17 LAB — POCT WET PREP (WET MOUNT): CLUE CELLS WET PREP WHIFF POC: POSITIVE

## 2015-10-17 MED ORDER — METRONIDAZOLE 500 MG PO TABS: 500.0000 mg | ORAL_TABLET | Freq: Two times a day (BID) | ORAL | 0 refills | Status: DC

## 2015-10-17 NOTE — MAU Provider Note (Signed)
Chief Complaint:  Contractions   First Provider Initiated Contact with Patient 10/17/15 1832     HPI  Dana Lam is a 25 y.o. G2P0010 at 27w3dwho presents to maternity admissions reporting uterine contractions.  Also has some vaginal discharge, treated for BV.  FFn contaminated with blood from friable cervix.. She reports good fetal movement, denies LOF, vaginal itching/burning, urinary symptoms, h/a, dizziness, n/v, diarrhea, constipation or fever/chills.    CNM Note: UCs q since last night, didn't want to go to [facility] and potentially be there for hours. Nothing in vagina in last 24hrs. Denies abnormal d/c, itching/odor/irritation.  Spec exam: thin white malodorous d/c at instroitus, cx visually closed, fFN collected-cx friable and began to bleed which contaminated the fFN specimen.  Wet prep collected and +for many clues=bv Rx metronidazole  BID x 7d for BV, no sex while taking  SVE: outer os 2cm/inner os closed/th/-2 Placed on NST to evaluate uc's: Cat 1, reactive, uc's ~ q 3-39mins   Past Medical History: Past Medical History:  Diagnosis Date  . Nausea and vomiting during pregnancy 05/11/2015    Past obstetric history: OB History  Gravida Para Term Preterm AB Living  2       1 0  SAB TAB Ectopic Multiple Live Births  1            # Outcome Date GA Lbr Len/2nd Weight Sex Delivery Anes PTL Lv  2 Current           1 SAB               Past Surgical History: No past surgical history on file.  Family History: Family History  Problem Relation Age of Onset  . Hypertension Mother   . Heart attack Maternal Grandfather   . Cancer Paternal Grandmother     stomach  . Cancer Paternal Grandfather     lung  . Bipolar disorder Brother   . Sleep apnea Brother   . Heart disease Maternal Grandmother   . Other Maternal Grandmother     blood clot  . Arthritis Maternal Grandmother   . Thyroid disease Maternal Grandmother   . Heart attack Maternal Uncle     Social  History: Social History  Substance Use Topics  . Smoking status: Current Every Day Smoker    Years: 5.00    Types: Cigarettes  . Smokeless tobacco: Never Used     Comment: smokes 4-5 cig daily  . Alcohol use No     Comment: occasional    Allergies:  Allergies  Allergen Reactions  . Penicillins Hives    Meds:  Prescriptions Prior to Admission  Medication Sig Dispense Refill Last Dose  . metroNIDAZOLE (FLAGYL) 500 MG tablet Take 1 tablet (500 mg total) by mouth 2 (two) times daily. X 7 days. No sex or alcohol while taking 14 tablet 0   . Pediatric Multiple Vit-C-FA (FLINSTONES GUMMIES OMEGA-3 DHA) CHEW Chew 1 tablet by mouth daily.   Taking    I have reviewed patient's Past Medical Hx, Surgical Hx, Family Hx, Social Hx, medications and allergies.   ROS:  Review of Systems  Constitutional: Negative for chills and fever.  Gastrointestinal: Negative for abdominal pain, constipation, diarrhea, nausea and vomiting.  Genitourinary: Negative for dysuria.       States is not feeling contractions anymore  Musculoskeletal: Negative for back pain.   Other systems negative  Physical Exam  No data found.  Vitals:   10/17/15 1858 10/17/15 1927  BP:  126/73  Pulse: 94 99  Resp:  16  Temp:      Constitutional: Well-developed, well-nourished female in no acute distress.  Cardiovascular: normal rate and rhythm Respiratory: normal effort, clear to auscultation bilaterally GI: Abd soft, non-tender, gravid appropriate for gestational age.   No rebound or guarding. MS: Extremities nontender, no edema, normal ROM Neurologic: Alert and oriented x 4.  GU: Neg CVAT.   FHT:  Baseline 125 , moderate variability, accelerations present, no decelerations Contractions: none observed in 1.5 hours of monitoring   Labs: Results for orders placed or performed in visit on 10/17/15 (from the past 24 hour(s))  POCT Wet Prep Dana Lam Dana Lam)     Status: Abnormal   Collection Time: 10/17/15  4:36 PM   Result Value Ref Range   Source Wet Prep POC vaginal    WBC, Wet Prep HPF POC few    Bacteria Wet Prep HPF POC None None, Few, Too numerous to count   BACTERIA WET PREP MORPHOLOGY POC     Clue Cells Wet Prep HPF POC Many (A) None, Too numerous to count   Clue Cells Wet Prep Whiff POC Positive Whiff    Yeast Wet Prep HPF POC None    KOH Wet Prep POC     Trichomonas Wet Prep HPF POC none    B/Positive/-- (02/16 1526)  Imaging:  US Ob Follow Up  Result Date: 09/21/2015 FOLLOW UP SONOGRAM Dana Lam is in the office for a follow up sonogram to recheck spine not seen on previous ultrasound . She is a 25 y.o. year old G2P0010 with Estimated Date of Delivery: 12/16/15 by LMP now at  [redacted]w[redacted]d weeks gestation. Thus far the pregnancy has been uncomplicated. GESTATION: SINGLETON PRESENTATION: cephalic FETAL ACTIVITY:          Heart rate         136          The fetus is active. AMNIOTIC FLUID: The amniotic fluid volume is  normal, 11.7 cm. PLACENTA LOCALIZATION:  anterior GRADE 1 CERVIX: Measures 3.5 cm ADNEXA: The ovaries are normal. GESTATIONAL AGE AND  BIOMETRICS: Gestational criteria: Estimated Date of Delivery: 12/16/15 by LMP now at [redacted]w[redacted]d Previous Scans:3          BIPARIETAL DIAMETER           7.11 cm         28+4 weeks HEAD CIRCUMFERENCE           26.17 cm         28+3 weeks ABDOMINAL CIRCUMFERENCE           23.50 cm         27+6 weeks FEMUR LENGTH           5.28 cm         28+1 weeks                                                       AVERAGE EGA(BY THIS SCAN):  28+2 weeks                                                 ESTIMATED FETAL WEIGHT:  1180  grams, 58 % ANATOMICAL SURVEY                                                                            COMMENTS CEREBRAL VENTRICLES yes normal  CHOROID PLEXUS yes normal  CEREBELLUM yes normal  CISTERNA MAGNA    NUCHAL REGION    ORBITS yes normal  NASAL BONE    NOSE/LIP    FACIAL PROFILE yes normal  4 CHAMBERED HEART yes normal  OUTFLOW TRACTS  yes normal  DIAPHRAGM yes normal  STOMACH yes normal  RENAL REGION yes abnormal  Right pyelectasis 6 mm BLADDER yes normal  CORD INSERTION yes normal  3 VESSEL CORD yes normal  SPINE yes normal  ARMS/HANDS    LEGS/FEET    GENITALIA yes normal female     SUSPECTED ABNORMALITIES:  yes Right pyelectasis 6 mm QUALITY OF SCAN: satisfactory TECHNICIAN COMMENTS: [place] 27+5 wks,cephalic,cx 3.5cm,normal ov's bilat,ant pl gr 1,afi 11.7,anatomy of the spine complete,no obvious abnormalities,RK pyelectasis 6 mm,efw 1180 g 58 % A copy of this report including all images has been saved and backed up to a second source for retrieval if needed. All measures and details of the anatomical scan, placentation, fluid volume and pelvic anatomy are contained in that report. Amber Flora Lipps 09/21/2015 10:19 AM Clinical Impression and recommendations: I have reviewed the sonogram results above, combined with the patient's current clinical course, below are my impressions and any appropriate recommendations for management based on the sonographic findings. 1.  G2P0010 Estimated Date of Delivery: 12/16/15 by  LMP, early ultrasound and confirmed by today's sonographic dating 2.  Grade 1, mild right pyelectasis, otherwise Normal fetal sonographic findings, specifically normal detailed anatomical evaluation,      no abnormalities noted 3.  Normal general sonographic findings Recommend routine prenatal care based on this sonogram or as clinically indicated EURE,LUTHER H 09/21/2015 10:53 AM    MAU Course/MDM:  NST reviewed.  No contractions seen Will discharge home with PTL precautions and pelvic rest   Assessment: SIUP at [redacted]w[redacted]d  Preterm contractions, now resolved  Plan: Discharge home Preterm Labor precautions and fetal kick counts Pelvic rest Follow up in Office for prenatal visits and recheck of cervix    Medication List    ASK your doctor about these medications   FLINSTONES GUMMIES OMEGA-3 DHA Chew Chew 1 tablet by mouth daily.    metroNIDAZOLE 500 MG tablet Commonly known as:  FLAGYL Take 1 tablet (500 mg total) by mouth 2 (two) times daily. X 7 days. No sex or alcohol while taking      Pt stable at time of discharge.  Encouraged to return here or to other Urgent Care/ED if she develops worsening of symptoms, increase in pain, fever, or other concerning symptoms.      Wynelle Bourgeois CNM, MSN Certified Nurse-Midwife 10/17/2015 6:32 PM

## 2015-10-17 NOTE — Discharge Instructions (Signed)

## 2015-10-17 NOTE — Progress Notes (Signed)
Work-in Low-risk OB appointment G2P0010 [redacted]w[redacted]d Estimated Date of Delivery: 12/16/15 BP 134/64   Pulse 72   Wt 183 lb (83 kg)   LMP 03/11/2015 (Exact Date)   BMI 26.26 kg/m   BP, weight, and urine reviewed.  Refer to obstetrical flow sheet for FH & FHR.  Reports good fm.  Denies lof, vb, or uti s/s. UCs q since last night, didn't want to go to [facility] and potentially be there for hours. Nothing in vagina in last 24hrs. Denies abnormal d/c, itching/odor/irritation.   Spec exam: thin white malodorous d/c at instroitus, cx visually closed, fFN collected-cx friable and began to bleed which contaminated the fFN specimen.  Wet prep collected and +for many clues=bv Rx metronidazole 500mg  BID x 7d for BV, no sex while taking  SVE: outer os 2cm/inner os closed/th/-2 Placed on NST to evaluate uc's: Cat 1, reactive, uc's ~ q 3-23mins  To [facility] for further eval, called [place], CNM and notified her to expect pt Urine cx, gc/ct sent, unable to send fFN d/t contamination w/ blood  Reviewed ptl s/s, fkc F/U as scheduled for OB appointment 8/3

## 2015-10-17 NOTE — MAU Note (Signed)
Sent from the office for fetal monitoring.

## 2015-10-17 NOTE — Patient Instructions (Signed)
Call the office [phone]) or go to [facility] if:  You begin to have strong, frequent contractions  Your water breaks.  Sometimes it is a big gush of fluid, sometimes it is just a trickle that keeps getting your panties wet or running down your legs  You have vaginal bleeding.  It is normal to have a small amount of spotting if your cervix was checked.   You don't feel your baby moving like normal.  If you don't, get you something to eat and drink and lay down and focus on feeling your baby move.  You should feel at least 10 movements in 2 hours.  If you don't, you should call the office or go to [facility].     Preterm Labor Information Preterm labor is when labor starts at less than 37 weeks of pregnancy. The normal length of a pregnancy is 39 to 41 weeks. CAUSES Often, there is no identifiable underlying cause as to why a woman goes into preterm labor. One of the most common known causes of preterm labor is infection. Infections of the uterus, cervix, vagina, amniotic sac, bladder, kidney, or even the lungs (pneumonia) can cause labor to start. Other suspected causes of preterm labor include:   Urogenital infections, such as yeast infections and bacterial vaginosis.   Uterine abnormalities (uterine shape, uterine septum, fibroids, or bleeding from the placenta).   A cervix that has been operated on (it may fail to stay closed).   Malformations in the fetus.   Multiple gestations (twins, triplets, and so on).   Breakage of the amniotic sac.  RISK FACTORS  Having a previous history of preterm labor.   Having premature rupture of membranes (PROM).   Having a placenta that covers the opening of the cervix (placenta previa).   Having a placenta that separates from the uterus (placental abruption).   Having a cervix that is too weak to hold the fetus in the uterus (incompetent cervix).   Having too much fluid in the amniotic sac (polyhydramnios).    Taking illegal drugs or smoking while pregnant.   Not gaining enough weight while pregnant.   Being younger than 29 and older than 24 years old.   Having a low socioeconomic status.   Being African American. SYMPTOMS Signs and symptoms of preterm labor include:   Menstrual-like cramps, abdominal pain, or back pain.  Uterine contractions that are regular, as frequent as six in an hour, regardless of their intensity (may be mild or painful).  Contractions that start on the top of the uterus and spread down to the lower abdomen and back.   A sense of increased pelvic pressure.   A watery or bloody mucus discharge that comes from the vagina.  TREATMENT Depending on the length of the pregnancy and other circumstances, your health care provider may suggest bed rest. If necessary, there are medicines that can be given to stop contractions and to mature the fetal lungs. If labor happens before 34 weeks of pregnancy, a prolonged hospital stay may be recommended. Treatment depends on the condition of both you and the fetus.  WHAT SHOULD YOU DO IF YOU THINK YOU ARE IN PRETERM LABOR? Call your health care provider right away. You will need to go to the hospital to get checked immediately. HOW CAN YOU PREVENT PRETERM LABOR IN FUTURE PREGNANCIES? You should:   Stop smoking if you smoke.  Maintain healthy weight gain and avoid chemicals and drugs that are not necessary.  Be watchful  for any type of infection.  Inform your health care provider if you have a known history of preterm labor.   This information is not intended to replace advice given to you by your health care provider. Make sure you discuss any questions you have with your health care provider.   Document Released: 06/01/2003 Document Revised: 11/11/2012 Document Reviewed: 04/13/2012 Elsevier Interactive Patient Education 2016 [employer]   Bacterial Vaginosis Bacterial vaginosis is a vaginal infection that  occurs when the normal balance of bacteria in the vagina is disrupted. It results from an overgrowth of certain bacteria. This is the most common vaginal infection in women of childbearing age. Treatment is important to prevent complications, especially in pregnant women, as it can cause a premature delivery. CAUSES  Bacterial vaginosis is caused by an increase in harmful bacteria that are normally present in smaller amounts in the vagina. Several different kinds of bacteria can cause bacterial vaginosis. However, the reason that the condition develops is not fully understood. RISK FACTORS Certain activities or behaviors can put you at an increased risk of developing bacterial vaginosis, including:  Having a new sex partner or multiple sex partners.  Douching.  Using an intrauterine device (IUD) for contraception. Women do not get bacterial vaginosis from toilet seats, bedding, swimming pools, or contact with objects around them. SIGNS AND SYMPTOMS  Some women with bacterial vaginosis have no signs or symptoms. Common symptoms include:  Grey vaginal discharge.  A fishlike odor with discharge, especially after sexual intercourse.  Itching or burning of the vagina and vulva.  Burning or pain with urination. DIAGNOSIS  Your health care provider will take a medical history and examine the vagina for signs of bacterial vaginosis. A sample of vaginal fluid may be taken. Your health care provider will look at this sample under a microscope to check for bacteria and abnormal cells. A vaginal pH test may also be done.  TREATMENT  Bacterial vaginosis may be treated with antibiotic medicines. These may be given in the form of a pill or a vaginal cream. A second round of antibiotics may be prescribed if the condition comes back after treatment. Because bacterial vaginosis increases your risk for sexually transmitted diseases, getting treated can help reduce your risk for chlamydia, gonorrhea, HIV, and  herpes. HOME CARE INSTRUCTIONS   Only take over-the-counter or prescription medicines as directed by your health care provider.  If antibiotic medicine was prescribed, take it as directed. Make sure you finish it even if you start to feel better.  Tell all sexual partners that you have a vaginal infection. They should see their health care provider and be treated if they have problems, such as a mild rash or itching.  During treatment, it is important that you follow these instructions:  Avoid sexual activity or use condoms correctly.  Do not douche.  Avoid alcohol as directed by your health care provider.  Avoid breastfeeding as directed by your health care provider. SEEK MEDICAL CARE IF:   Your symptoms are not improving after 3 days of treatment.  You have increased discharge or pain.  You have a fever. MAKE SURE YOU:   Understand these instructions.  Will watch your condition.  Will get help right away if you are not doing well or get worse. FOR MORE INFORMATION  Centers for Disease Control and Prevention, Division of STD Prevention: [employer] American Sexual Health Association (ASHA): www.ashastd.org    This information is not intended to replace advice given to you by  your health care provider. Make sure you discuss any questions you have with your health care provider.   Document Released: 03/11/2005 Document Revised: 04/01/2014 Document Reviewed: 10/21/2012 Elsevier Interactive Patient Education 2016 Elsevier Inc.  

## 2015-10-18 LAB — GC/CHLAMYDIA PROBE AMP
Chlamydia trachomatis, NAA: NEGATIVE
NEISSERIA GONORRHOEAE BY PCR: NEGATIVE

## 2015-10-19 LAB — URINE CULTURE: Organism ID, Bacteria: NO GROWTH

## 2015-10-26 ENCOUNTER — Ambulatory Visit (INDEPENDENT_AMBULATORY_CARE_PROVIDER_SITE_OTHER): Payer: Medicaid Other | Admitting: Advanced Practice Midwife

## 2015-10-26 VITALS — BP 128/80 | HR 86 | Wt 188.0 lb

## 2015-10-26 DIAGNOSIS — Z3483 Encounter for supervision of other normal pregnancy, third trimester: Secondary | ICD-10-CM

## 2015-10-26 DIAGNOSIS — Z1389 Encounter for screening for other disorder: Secondary | ICD-10-CM

## 2015-10-26 DIAGNOSIS — Z3493 Encounter for supervision of normal pregnancy, unspecified, third trimester: Secondary | ICD-10-CM

## 2015-10-26 DIAGNOSIS — Z331 Pregnant state, incidental: Secondary | ICD-10-CM

## 2015-10-26 DIAGNOSIS — Z3A36 36 weeks gestation of pregnancy: Secondary | ICD-10-CM

## 2015-10-26 LAB — POCT URINALYSIS DIPSTICK
Blood, UA: NEGATIVE
GLUCOSE UA: NEGATIVE
Ketones, UA: NEGATIVE
Leukocytes, UA: NEGATIVE
NITRITE UA: NEGATIVE

## 2015-10-26 NOTE — Progress Notes (Signed)
G2P0010 [redacted]w[redacted]d Estimated Date of Delivery: 12/16/15  Blood pressure 128/80, pulse 86, weight 188 lb (85.3 kg), last menstrual period 03/11/2015, unknown if currently breastfeeding.   BP weight and urine results all reviewed and noted.  Please refer to the obstetrical flow sheet for the fundal height and fetal heart rate documentation:  Patient reports good fetal movement, denies any bleeding and no rupture of membranes symptoms or regular contractions. Patient is without complaints other that MSK LBP All questions were answered.  Orders Placed This Encounter  Procedures  . POCT urinalysis dipstick    Plan:  Continued routine obstetrical care,   Return in about 2 weeks (around 11/09/2015) for LROB.

## 2015-10-26 NOTE — Patient Instructions (Addendum)
Third Trimester of Pregnancy The third trimester is from week 29 through week 42, months 7 through 9. The third trimester is a time when the fetus is growing rapidly. At the end of the ninth month, the fetus is about 20 inches in length and weighs 6-10 pounds.  BODY CHANGES Your body goes through many changes during pregnancy. The changes vary from woman to woman.   Your weight will continue to increase. You can expect to gain 25-35 pounds (11-16 kg) by the end of the pregnancy.  You may begin to get stretch marks on your hips, abdomen, and breasts.  You may urinate more often because the fetus is moving lower into your pelvis and pressing on your bladder.  You may develop or continue to have heartburn as a result of your pregnancy.  You may develop constipation because certain hormones are causing the muscles that push waste through your intestines to slow down.  You may develop hemorrhoids or swollen, bulging veins (varicose veins).  You may have pelvic pain because of the weight gain and pregnancy hormones relaxing your joints between the bones in your pelvis. Backaches may result from overexertion of the muscles supporting your posture.  You may have changes in your hair. These can include thickening of your hair, rapid growth, and changes in texture. Some women also have hair loss during or after pregnancy, or hair that feels dry or thin. Your hair will most likely return to normal after your baby is born.  Your breasts will continue to grow and be tender. A yellow discharge may leak from your breasts called colostrum.  Your belly button may stick out.  You may feel short of breath because of your expanding uterus.  You may notice the fetus "dropping," or moving lower in your abdomen.  You may have a bloody mucus discharge. This usually occurs a few days to a week before labor begins.  Your cervix becomes thin and soft (effaced) near your due date. WHAT TO EXPECT AT YOUR PRENATAL  EXAMS  You will have prenatal exams every 2 weeks until week 36. Then, you will have weekly prenatal exams. During a routine prenatal visit:  You will be weighed to make sure you and the fetus are growing normally.  Your blood pressure is taken.  Your abdomen will be measured to track your baby's growth.  The fetal heartbeat will be listened to.  Any test results from the previous visit will be discussed.  You may have a cervical check near your due date to see if you have effaced. At around 36 weeks, your caregiver will check your cervix. At the same time, your caregiver will also perform a test on the secretions of the vaginal tissue. This test is to determine if a type of bacteria, Group B streptococcus, is present. Your caregiver will explain this further. Your caregiver may ask you:  What your birth plan is.  How you are feeling.  If you are feeling the baby move.  If you have had any abnormal symptoms, such as leaking fluid, bleeding, severe headaches, or abdominal cramping.  If you are using any tobacco products, including cigarettes, chewing tobacco, and electronic cigarettes.  If you have any questions. Other tests or screenings that may be performed during your third trimester include:  Blood tests that check for low iron levels (anemia).  Fetal testing to check the health, activity level, and growth of the fetus. Testing is done if you have certain medical conditions or if   there are problems during the pregnancy.  HIV (human immunodeficiency virus) testing. If you are at high risk, you may be screened for HIV during your third trimester of pregnancy. FALSE LABOR You may feel small, irregular contractions that eventually go away. These are called Braxton Hicks contractions, or false labor. Contractions may last for hours, days, or even weeks before true labor sets in. If contractions come at regular intervals, intensify, or become painful, it is best to be seen by your  caregiver.  SIGNS OF LABOR   Menstrual-like cramps.  Contractions that are 5 minutes apart or less.  Contractions that start on the top of the uterus and spread down to the lower abdomen and back.  A sense of increased pelvic pressure or back pain.  A watery or bloody mucus discharge that comes from the vagina. If you have any of these signs before the 37th week of pregnancy, call your caregiver right away. You need to go to the hospital to get checked immediately. HOME CARE INSTRUCTIONS   Avoid all smoking, herbs, alcohol, and unprescribed drugs. These chemicals affect the formation and growth of the baby.  Do not use any tobacco products, including cigarettes, chewing tobacco, and electronic cigarettes. If you need help quitting, ask your health care provider. You may receive counseling support and other resources to help you quit.  Follow your caregiver's instructions regarding medicine use. There are medicines that are either safe or unsafe to take during pregnancy.  Exercise only as directed by your caregiver. Experiencing uterine cramps is a good sign to stop exercising.  Continue to eat regular, healthy meals.  Wear a good support bra for breast tenderness.  Do not use hot tubs, steam rooms, or saunas.  Wear your seat belt at all times when driving.  Avoid raw meat, uncooked cheese, cat litter boxes, and soil used by cats. These carry germs that can cause birth defects in the baby.  Take your prenatal vitamins.  Take 1500-2000 mg of calcium daily starting at the 20th week of pregnancy until you deliver your baby.  Try taking a stool softener (if your caregiver approves) if you develop constipation. Eat more high-fiber foods, such as fresh vegetables or fruit and whole grains. Drink plenty of fluids to keep your urine clear or pale yellow.  Take warm sitz baths to soothe any pain or discomfort caused by hemorrhoids. Use hemorrhoid cream if your caregiver approves.  If  you develop varicose veins, wear support hose. Elevate your feet for 15 minutes, 3-4 times a day. Limit salt in your diet.  Avoid heavy lifting, wear low heal shoes, and practice good posture.  Rest a lot with your legs elevated if you have leg cramps or low back pain.  Visit your dentist if you have not gone during your pregnancy. Use a soft toothbrush to brush your teeth and be gentle when you floss.  A sexual relationship may be continued unless your caregiver directs you otherwise.  Do not travel far distances unless it is absolutely necessary and only with the approval of your caregiver.  Take prenatal classes to understand, practice, and ask questions about the labor and delivery.  Make a trial run to the hospital.  Pack your hospital bag.  Prepare the baby's nursery.  Continue to go to all your prenatal visits as directed by your caregiver. SEEK MEDICAL CARE IF:  You are unsure if you are in labor or if your water has broken.  You have dizziness.  You have  mild pelvic cramps, pelvic pressure, or nagging pain in your abdominal area.  You have persistent nausea, vomiting, or diarrhea.  You have a bad smelling vaginal discharge.  You have pain with urination. SEEK IMMEDIATE MEDICAL CARE IF:   You have a fever.  You are leaking fluid from your vagina.  You have spotting or bleeding from your vagina.  You have severe abdominal cramping or pain.  You have rapid weight loss or gain.  You have shortness of breath with chest pain.  You notice sudden or extreme swelling of your face, hands, ankles, feet, or legs.  You have not felt your baby move in over an hour.  You have severe headaches that do not go away with medicine.  You have vision changes.   This information is not intended to replace advice given to you by your health care provider. Make sure you discuss any questions you have with your health care provider.   Document Released: 03/05/2001 Document  Revised: 04/01/2014 Document Reviewed: 05/12/2012 Elsevier Interactive Patient Education 2016 [employer]  . Back Pain in Pregnancy Back pain during pregnancy is common. It happens in about half of all pregnancies. It is important for you and your baby that you remain active during your pregnancy.If you feel that back pain is not allowing you to remain active or sleep well, it is time to see your caregiver. Back pain may be caused by several factors related to changes during your pregnancy.Fortunately, unless you had trouble with your back before your pregnancy, the pain is likely to get better after you deliver. Low back pain usually occurs between the fifth and seventh months of pregnancy. It can, however, happen in the first couple months. Factors that increase the risk of back problems include:   Previous back problems.  Injury to your back.  Having twins or multiple births.  A chronic cough.  Stress.  Job-related repetitive motions.  Muscle or spinal disease in the back.  Family history of back problems, ruptured (herniated) discs, or osteoporosis.  Depression, anxiety, and panic attacks. CAUSES   When you are pregnant, your body produces a hormone called relaxin. This hormonemakes the ligaments connecting the low back and pubic bones more flexible. This flexibility allows the baby to be delivered more easily. When your ligaments are loose, your muscles need to work harder to support your back. Soreness in your back can come from tired muscles. Soreness can also come from back tissues that are irritated since they are receiving less support.  As the baby grows, it puts pressure on the nerves and blood vessels in your pelvis. This can cause back pain.  As the baby grows and gets heavier during pregnancy, the uterus pushes the stomach muscles forward and changes your center of gravity. This makes your back muscles work harder to maintain good posture. SYMPTOMS  Lumbar pain  during pregnancy Lumbar pain during pregnancy usually occurs at or above the waist in the center of the back. There may be pain and numbness that radiates into your leg or foot. This is similar to low back pain experienced by non-pregnant women. It usually increases with sitting for long periods of time, standing, or repetitive lifting. Tenderness may also be present in the muscles along your upper back. Posterior pelvic pain during pregnancy Pain in the back of the pelvis is more common than lumbar pain in pregnancy. It is a deep pain felt in your side at the waistline, or across the tailbone (sacrum), or in  both places. You may have pain on one or both sides. This pain can also go into the buttocks and backs of the upper thighs. Pubic and groin pain may also be present. The pain does not quickly resolve with rest, and morning stiffness may also be present. Pelvic pain during pregnancy can be brought on by most activities. A high level of fitness before and during pregnancy may or may not prevent this problem. Labor pain is usually 1 to 2 minutes apart, lasts for about 1 minute, and involves a bearing down feeling or pressure in your pelvis. However, if you are at term with the pregnancy, constant low back pain can be the beginning of early labor, and you should be aware of this. DIAGNOSIS  X-rays of the back should not be done during the first 12 to 14 weeks of the pregnancy and only when absolutely necessary during the rest of the pregnancy. MRIs do not give off radiation and are safe during pregnancy. MRIs also should only be done when absolutely necessary. HOME CARE INSTRUCTIONS  Exercise as directed by your caregiver. Exercise is the most effective way to prevent or manage back pain. If you have a back problem, it is especially important to avoid sports that require sudden body movements. Swimming and walking are great activities.  Do not stand in one place for long periods of time.  Do not wear  high heels.  Sit in chairs with good posture. Use a pillow on your lower back if necessary. Make sure your head rests over your shoulders and is not hanging forward.  Try sleeping on your side, preferably the left side, with a pillow or two between your legs. If you are sore after a night's rest, your bedmay betoo soft.Try placing a board between your mattress and box spring.  Listen to your body when lifting.If you are experiencing pain, ask for help or try bending yourknees more so you can use your leg muscles rather than your back muscles. Squat down when picking up something from the floor. Do not bend over.  Eat a healthy diet. Try to gain weight within your caregiver's recommendations.  Use heat or cold packs 3 to 4 times a day for 15 minutes to help with the pain.  Only take over-the-counter or prescription medicines for pain, discomfort, or fever as directed by your caregiver. Sudden (acute) back pain  Use bed rest for only the most extreme, acute episodes of back pain. Prolonged bed rest over 48 hours will aggravate your condition.  Ice is very effective for acute conditions.  Put ice in a plastic bag.  Place a towel between your skin and the bag.  Leave the ice on for 10 to 20 minutes every 2 hours, or as needed.  Using heat packs for 30 minutes prior to activities is also helpful. Continued back pain See your caregiver if you have continued problems. Your caregiver can help or refer you for appropriate physical therapy. With conditioning, most back problems can be avoided. Sometimes, a more serious issue may be the cause of back pain. You should be seen right away if new problems seem to be developing. Your caregiver may recommend:  A maternity girdle.  An elastic sling.  A back brace.  A massage therapist or acupuncture. SEEK MEDICAL CARE IF:   You are not able to do most of your daily activities, even when taking the pain medicine you were given.  You need a  referral to a physical therapist  or chiropractor.  You want to try acupuncture. SEEK IMMEDIATE MEDICAL CARE IF:  You develop numbness, tingling, weakness, or problems with the use of your arms or legs.  You develop severe back pain that is no longer relieved with medicines.  You have a sudden change in bowel or bladder control.  You have increasing pain in other areas of the body.  You develop shortness of breath, dizziness, or fainting.  You develop nausea, vomiting, or sweating.  You have back pain which is similar to labor pains.  You have back pain along with your water breaking or vaginal bleeding.  You have back pain or numbness that travels down your leg.  Your back pain developed after you fell.  You develop pain on one side of your back. You may have a kidney stone.  You see blood in your urine. You may have a bladder infection or kidney stone.  You have back pain with blisters. You may have shingles. Back pain is fairly common during pregnancy but should not be accepted as just part of the process. Back pain should always be treated as soon as possible. This will make your pregnancy as pleasant as possible.   This information is not intended to replace advice given to you by your health care provider. Make sure you discuss any questions you have with your health care provider.   Document Released: 06/19/2005 Document Revised: 06/03/2011 Document Reviewed: 07/31/2010 Elsevier Interactive Patient Education [employer]

## 2015-11-09 ENCOUNTER — Encounter: Payer: Self-pay | Admitting: Obstetrics and Gynecology

## 2015-11-09 ENCOUNTER — Ambulatory Visit (INDEPENDENT_AMBULATORY_CARE_PROVIDER_SITE_OTHER): Payer: Medicaid Other | Admitting: Obstetrics and Gynecology

## 2015-11-09 VITALS — BP 120/80 | HR 90 | Wt 188.0 lb

## 2015-11-09 DIAGNOSIS — Z331 Pregnant state, incidental: Secondary | ICD-10-CM

## 2015-11-09 DIAGNOSIS — Z1389 Encounter for screening for other disorder: Secondary | ICD-10-CM

## 2015-11-09 DIAGNOSIS — Z3483 Encounter for supervision of other normal pregnancy, third trimester: Secondary | ICD-10-CM

## 2015-11-09 DIAGNOSIS — Z3A35 35 weeks gestation of pregnancy: Secondary | ICD-10-CM

## 2015-11-09 LAB — POCT URINALYSIS DIPSTICK
Blood, UA: NEGATIVE
GLUCOSE UA: NEGATIVE
Ketones, UA: NEGATIVE
LEUKOCYTES UA: NEGATIVE
NITRITE UA: NEGATIVE
Protein, UA: NEGATIVE

## 2015-11-09 NOTE — Progress Notes (Signed)
Pt states that she has constant lower back pain. Pt states that nothing helps.

## 2015-11-09 NOTE — Progress Notes (Signed)
G2P0010 6557w5d Estimated Date of Delivery: 12/16/15 LROB  Patient reports good fetal movement, denies any bleeding and no rupture of membranes symptoms or regular contractions. Patient complaints: constant lower back pain. Pt states that [facility] Pt notes that this is her first pregnancy. Pt denies any other symptoms.   Blood pressure 120/80, pulse 90, weight 188 lb (85.3 kg), last menstrual period 03/11/2015, unknown if currently breastfeeding. refer to the ob flow sheet for FH and FHR, also BP, Wt, Urine results:notable for negative                          Physical Examination:  General appearance - alert, well appearing, and in no distress Abdomen - FH 34 cm                   -FHR 140 soft, nontender, nondistended, no masses or organomegaly  Pelvic - normal external genitalia, vulva, vagina, cervix, uterus and adnexa                                            Questions were answered. Assessment: LROB G2P0010 @ 2457w5d  Plan:  Continued routine obstetrical care  F/u in 2 weeks for routine OB and US  By signing my name below, I, Soijett Blue, attest that this documentation has been prepared under the direction and in the presence of Tilda BurrowJohn V Anis Cinelli, MD. Electronically Signed: Soijett Blue, ED Scribe. 11/09/15. 11:19 AM.  I personally performed the services described in this documentation, which was SCRIBED in my presence. The recorded information has been reviewed and considered accurate. It has been edited as necessary during review. Tilda BurrowFERGUSON,Emre Stock V, MD

## 2015-11-16 ENCOUNTER — Other Ambulatory Visit: Payer: Self-pay | Admitting: Obstetrics and Gynecology

## 2015-11-16 DIAGNOSIS — O35EXX0 Maternal care for other (suspected) fetal abnormality and damage, fetal genitourinary anomalies, not applicable or unspecified: Secondary | ICD-10-CM

## 2015-11-16 DIAGNOSIS — O358XX0 Maternal care for other (suspected) fetal abnormality and damage, not applicable or unspecified: Secondary | ICD-10-CM

## 2015-11-23 ENCOUNTER — Ambulatory Visit (INDEPENDENT_AMBULATORY_CARE_PROVIDER_SITE_OTHER): Payer: Medicaid Other | Admitting: Advanced Practice Midwife

## 2015-11-23 ENCOUNTER — Ambulatory Visit (INDEPENDENT_AMBULATORY_CARE_PROVIDER_SITE_OTHER): Payer: Medicaid Other

## 2015-11-23 ENCOUNTER — Encounter: Payer: Medicaid Other | Admitting: Advanced Practice Midwife

## 2015-11-23 ENCOUNTER — Encounter: Payer: Self-pay | Admitting: Advanced Practice Midwife

## 2015-11-23 VITALS — BP 120/72 | HR 86 | Wt 189.5 lb

## 2015-11-23 DIAGNOSIS — Z3492 Encounter for supervision of normal pregnancy, unspecified, second trimester: Secondary | ICD-10-CM

## 2015-11-23 DIAGNOSIS — Z1389 Encounter for screening for other disorder: Secondary | ICD-10-CM

## 2015-11-23 DIAGNOSIS — Z369 Encounter for antenatal screening, unspecified: Secondary | ICD-10-CM

## 2015-11-23 DIAGNOSIS — Z3A37 37 weeks gestation of pregnancy: Secondary | ICD-10-CM | POA: Diagnosis not present

## 2015-11-23 DIAGNOSIS — O358XX1 Maternal care for other (suspected) fetal abnormality and damage, fetus 1: Secondary | ICD-10-CM

## 2015-11-23 DIAGNOSIS — Z3493 Encounter for supervision of normal pregnancy, unspecified, third trimester: Secondary | ICD-10-CM

## 2015-11-23 DIAGNOSIS — Z3483 Encounter for supervision of other normal pregnancy, third trimester: Secondary | ICD-10-CM

## 2015-11-23 DIAGNOSIS — O358XX0 Maternal care for other (suspected) fetal abnormality and damage, not applicable or unspecified: Secondary | ICD-10-CM

## 2015-11-23 DIAGNOSIS — Z331 Pregnant state, incidental: Secondary | ICD-10-CM

## 2015-11-23 DIAGNOSIS — O35EXX0 Maternal care for other (suspected) fetal abnormality and damage, fetal genitourinary anomalies, not applicable or unspecified: Secondary | ICD-10-CM

## 2015-11-23 LAB — POCT URINALYSIS DIPSTICK
Blood, UA: NEGATIVE
GLUCOSE UA: NEGATIVE
Ketones, UA: NEGATIVE
NITRITE UA: NEGATIVE
PROTEIN UA: NEGATIVE

## 2015-11-23 LAB — OB RESULTS CONSOLE GBS: GBS: NEGATIVE

## 2015-11-23 NOTE — Progress Notes (Signed)
G2P0010 2325w5d Estimated Date of Delivery: 12/16/15  Blood pressure 120/72, pulse 86, weight 189 lb 8 oz (86 kg), last menstrual period 03/11/2015, unknown if currently breastfeeding.   BP weight and urine results all reviewed and noted.  Please refer to the obstetrical flow sheet for the fundal height and fetal heart rate documentation:  US to recheck pyelectasis:  US 36+5 wks,cephalic,ant pl gr 3,afi 15.3 cm,fhr 134 bpm,RK pyelectasis 7.9 mm,LK wnl 3.7 mm,EFW 3024 g 52%  Patient reports good fetal movement, denies any bleeding and no rupture of membranes symptoms or regular contractions. Patient is without complaints. All questions were answered.  Orders Placed This Encounter  Procedures  . Strep Gp B NAA+Rflx  . GC/Chlamydia Probe Amp  . POCT Urinalysis Dipstick    Plan:  Continued routine obstetrical care,   Return in about 1 week (around 11/30/2015) for LROB.

## 2015-11-23 NOTE — Progress Notes (Signed)
US 36+5 wks,cephalic,ant pl gr 3,afi 15.3 cm,fhr 134 bpm,RK pyelectasis 7.9 mm,LK wnl 3.7 mm,EFW 3024 g 52%

## 2015-11-24 ENCOUNTER — Encounter: Payer: Self-pay | Admitting: Obstetrics and Gynecology

## 2015-11-24 DIAGNOSIS — O35EXX0 Maternal care for other (suspected) fetal abnormality and damage, fetal genitourinary anomalies, not applicable or unspecified: Secondary | ICD-10-CM | POA: Insufficient documentation

## 2015-11-24 DIAGNOSIS — O358XX0 Maternal care for other (suspected) fetal abnormality and damage, not applicable or unspecified: Secondary | ICD-10-CM | POA: Insufficient documentation

## 2015-11-25 LAB — GC/CHLAMYDIA PROBE AMP
Chlamydia trachomatis, NAA: NEGATIVE
Neisseria gonorrhoeae by PCR: NEGATIVE

## 2015-11-25 LAB — STREP GP B NAA+RFLX: STREP GP B NAA+RFLX: NEGATIVE

## 2015-11-30 ENCOUNTER — Ambulatory Visit (INDEPENDENT_AMBULATORY_CARE_PROVIDER_SITE_OTHER): Payer: Medicaid Other | Admitting: Obstetrics and Gynecology

## 2015-11-30 VITALS — BP 120/72 | HR 98 | Wt 190.9 lb

## 2015-11-30 DIAGNOSIS — Z1389 Encounter for screening for other disorder: Secondary | ICD-10-CM

## 2015-11-30 DIAGNOSIS — Z331 Pregnant state, incidental: Secondary | ICD-10-CM

## 2015-11-30 DIAGNOSIS — Z3493 Encounter for supervision of normal pregnancy, unspecified, third trimester: Secondary | ICD-10-CM

## 2015-11-30 DIAGNOSIS — O358XX0 Maternal care for other (suspected) fetal abnormality and damage, not applicable or unspecified: Secondary | ICD-10-CM

## 2015-11-30 DIAGNOSIS — O35EXX0 Maternal care for other (suspected) fetal abnormality and damage, fetal genitourinary anomalies, not applicable or unspecified: Secondary | ICD-10-CM

## 2015-11-30 DIAGNOSIS — Z3490 Encounter for supervision of normal pregnancy, unspecified, unspecified trimester: Secondary | ICD-10-CM

## 2015-11-30 DIAGNOSIS — O283 Abnormal ultrasonic finding on antenatal screening of mother: Secondary | ICD-10-CM

## 2015-11-30 LAB — POCT URINALYSIS DIPSTICK
GLUCOSE UA: NEGATIVE
KETONES UA: NEGATIVE
Nitrite, UA: NEGATIVE

## 2015-11-30 NOTE — Progress Notes (Signed)
Dana Lam is a 25 y.o. female  [id number]  Estimated Date of Delivery: 12/16/15 LROB 2768w5d  Blood pressure 120/72, pulse 98, weight 190 lb 14.4 oz (86.6 kg), last menstrual period 03/11/2015, unknown if currently breastfeeding.   Urine results: notable for trace blood, trace protein and small leukocytes  Chief Complaint  Patient presents with  . Routine Prenatal Visit    c/o lower back pain    Patient has no acute complaints at this time; notes same back pain which she has been experiencing for a few weeks. She denies dysuria/urinary symptoms. Patient reports good fetal movement,                          denies any bleeding , rupture of membranes,or regular contractions.  refer to the ob flow sheet for FH and FHR, ,                          Physical Examination: General appearance - alert, well appearing, and in no distress and oriented to person, place, and time                                      Abdomen - FH 37 cm                                                          -FHR 131 bpm                                                         soft, nontender                                      Pelvic - examination not indicated                                            Questions were answered. Assessment: LROB G2P0010 @ 8668w5d   Plan:  Continued routine obstetrical care  F/u in 1 week for routine OB care.  By signing my name below, I, Dana Lam, attest that this documentation has been prepared under the direction and in the presence of Dana BurrowJohn Lam Vonzella Althaus, Lam . Electronically Signed: Freida Busmaniana Lam, Scribe. 11/30/2015. 11:57 AM. I personally performed the services described in this documentation, which was SCRIBED in my presence. The recorded information has been reviewed and considered accurate. It has been edited as necessary during review. Dana Lam,Dana Lam

## 2015-12-02 ENCOUNTER — Inpatient Hospital Stay (HOSPITAL_COMMUNITY)
Admission: AD | Admit: 2015-12-02 | Discharge: 2015-12-03 | Disposition: A | Discharge status: Home / Self Care | Payer: Medicaid Other | Source: Ambulatory Visit | Attending: Obstetrics and Gynecology | Admitting: Obstetrics and Gynecology

## 2015-12-02 DIAGNOSIS — Z3A38 38 weeks gestation of pregnancy: Secondary | ICD-10-CM | POA: Insufficient documentation

## 2015-12-02 DIAGNOSIS — Z3493 Encounter for supervision of normal pregnancy, unspecified, third trimester: Secondary | ICD-10-CM

## 2015-12-02 DIAGNOSIS — O471 False labor at or after 37 completed weeks of gestation: Secondary | ICD-10-CM | POA: Diagnosis present

## 2015-12-02 NOTE — MAU Note (Signed)
Bad back pain for couple days. Feels like someone stabbing me in back. Today pain comes around to stomach and pelvis. Denies bleeding or LOF

## 2015-12-03 ENCOUNTER — Encounter (HOSPITAL_COMMUNITY): Payer: Self-pay

## 2015-12-03 DIAGNOSIS — O471 False labor at or after 37 completed weeks of gestation: Secondary | ICD-10-CM | POA: Diagnosis not present

## 2015-12-03 LAB — URINE MICROSCOPIC-ADD ON

## 2015-12-03 LAB — URINALYSIS, ROUTINE W REFLEX MICROSCOPIC
Bilirubin Urine: NEGATIVE
GLUCOSE, UA: NEGATIVE mg/dL
HGB URINE DIPSTICK: NEGATIVE
KETONES UR: NEGATIVE mg/dL
Nitrite: NEGATIVE
PROTEIN: NEGATIVE mg/dL
Specific Gravity, Urine: <1.005 — ABNORMAL LOW (ref 1.005–1.030)
pH: 6 (ref 5.0–8.0)

## 2015-12-03 NOTE — Discharge Instructions (Signed)
Back Pain in Pregnancy °Back pain during pregnancy is common. It happens in about half of all pregnancies. It is important for you and your baby that you remain active during your pregnancy. If you feel that back pain is not allowing you to remain active or sleep well, it is time to see your caregiver. Back pain may be caused by several factors related to changes during your pregnancy. Fortunately, unless you had trouble with your back before your pregnancy, the pain is likely to get better after you deliver. °Low back pain usually occurs between the fifth and seventh months of pregnancy. It can, however, happen in the first couple months. Factors that increase the risk of back problems include:  °· Previous back problems. °· Injury to your back. °· Having twins or multiple births. °· A chronic cough. °· Stress. °· Job-related repetitive motions. °· Muscle or spinal disease in the back. °· Family history of back problems, ruptured (herniated) discs, or osteoporosis. °· Depression, anxiety, and panic attacks. °CAUSES  °· When you are pregnant, your body produces a hormone called relaxin. This hormone makes the ligaments connecting the low back and pubic bones more flexible. This flexibility allows the baby to be delivered more easily. When your ligaments are loose, your muscles need to work harder to support your back. Soreness in your back can come from tired muscles. Soreness can also come from back tissues that are irritated since they are receiving less support. °· As the baby grows, it puts pressure on the nerves and blood vessels in your pelvis. This can cause back pain. °· As the baby grows and gets heavier during pregnancy, the uterus pushes the stomach muscles forward and changes your center of gravity. This makes your back muscles work harder to maintain good posture. °SYMPTOMS  °Lumbar pain during pregnancy °Lumbar pain during pregnancy usually occurs at or above the waist in the center of the back. There  may be pain and numbness that radiates into your leg or foot. This is similar to low back pain experienced by non-pregnant women. It usually increases with sitting for long periods of time, standing, or repetitive lifting. Tenderness may also be present in the muscles along your upper back. °Posterior pelvic pain during pregnancy °Pain in the back of the pelvis is more common than lumbar pain in pregnancy. It is a deep pain felt in your side at the waistline, or across the tailbone (sacrum), or in both places. You may have pain on one or both sides. This pain can also go into the buttocks and backs of the upper thighs. Pubic and groin pain may also be present. The pain does not quickly resolve with rest, and morning stiffness may also be present. °Pelvic pain during pregnancy can be brought on by most activities. A high level of fitness before and during pregnancy may or may not prevent this problem. Labor pain is usually 1 to 2 minutes apart, lasts for about 1 minute, and involves a bearing down feeling or pressure in your pelvis. However, if you are at term with the pregnancy, constant low back pain can be the beginning of early labor, and you should be aware of this. °DIAGNOSIS  °X-rays of the back should not be done during the first 12 to 14 weeks of the pregnancy and only when absolutely necessary during the rest of the pregnancy. MRIs do not give off radiation and are safe during pregnancy. MRIs also should only be done when absolutely necessary. °HOME CARE INSTRUCTIONS °· Exercise   as directed by your caregiver. Exercise is the most effective way to prevent or manage back pain. If you have a back problem, it is especially important to avoid sports that require sudden body movements. Swimming and walking are great activities. °· Do not stand in one place for long periods of time. °· Do not wear high heels. °· Sit in chairs with good posture. Use a pillow on your lower back if necessary. Make sure your head  rests over your shoulders and is not hanging forward. °· Try sleeping on your side, preferably the left side, with a pillow or two between your legs. If you are sore after a night's rest, your bed may be too soft. Try placing a board between your mattress and box spring. °· Listen to your body when lifting. If you are experiencing pain, ask for help or try bending your knees more so you can use your leg muscles rather than your back muscles. Squat down when picking up something from the floor. Do not bend over. °· Eat a healthy diet. Try to gain weight within your caregiver's recommendations. °· Use heat or cold packs 3 to 4 times a day for 15 minutes to help with the pain. °· Only take over-the-counter or prescription medicines for pain, discomfort, or fever as directed by your caregiver. °Sudden (acute) back pain °· Use bed rest for only the most extreme, acute episodes of back pain. Prolonged bed rest over 48 hours will aggravate your condition. °· Ice is very effective for acute conditions. °¨ Put ice in a plastic bag. °¨ Place a towel between your skin and the bag. °¨ Leave the ice on for 10 to 20 minutes every 2 hours, or as needed. °· Using heat packs for 30 minutes prior to activities is also helpful. °Continued back pain °See your caregiver if you have continued problems. Your caregiver can help or refer you for appropriate physical therapy. With conditioning, most back problems can be avoided. Sometimes, a more serious issue may be the cause of back pain. You should be seen right away if new problems seem to be developing. Your caregiver may recommend: °· A maternity girdle. °· An elastic sling. °· A back brace. °· A massage therapist or acupuncture. °SEEK MEDICAL CARE IF:  °· You are not able to do most of your daily activities, even when taking the pain medicine you were given. °· You need a referral to a physical therapist or chiropractor. °· You want to try acupuncture. °SEEK IMMEDIATE MEDICAL CARE  IF: °· You develop numbness, tingling, weakness, or problems with the use of your arms or legs. °· You develop severe back pain that is no longer relieved with medicines. °· You have a sudden change in bowel or bladder control. °· You have increasing pain in other areas of the body. °· You develop shortness of breath, dizziness, or fainting. °· You develop nausea, vomiting, or sweating. °· You have back pain which is similar to labor pains. °· You have back pain along with your water breaking or vaginal bleeding. °· You have back pain or numbness that travels down your leg. °· Your back pain developed after you fell. °· You develop pain on one side of your back. You may have a kidney stone. °· You see blood in your urine. You may have a bladder infection or kidney stone. °· You have back pain with blisters. You may have shingles. °Back pain is fairly common during pregnancy but should not be accepted as just part of   the process. Back pain should always be treated as soon as possible. This will make your pregnancy as pleasant as possible.   This information is not intended to replace advice given to you by your health care provider. Make sure you discuss any questions you have with your health care provider.   Document Released: 06/19/2005 Document Revised: 06/03/2011 Document Reviewed: 07/31/2010 Elsevier Interactive Patient Education [employer] Third Trimester of Pregnancy The third trimester is from week 29 through week 42, months 7 through 9. This trimester is when your unborn baby (fetus) is growing very fast. At the end of the ninth month, the unborn baby is about 20 inches in length. It weighs about 6-10 pounds.  HOME CARE   Avoid all smoking, herbs, and alcohol. Avoid drugs not approved by your doctor.  Do not use any tobacco products, including cigarettes, chewing tobacco, and electronic cigarettes. If you need help quitting, ask your doctor. You may get counseling or other support to  help you quit.  Only take medicine as told by your doctor. Some medicines are safe and some are not during pregnancy.  Exercise only as told by your doctor. Stop exercising if you start having cramps.  Eat regular, healthy meals.  Wear a good support bra if your breasts are tender.  Do not use hot tubs, steam rooms, or saunas.  Wear your seat belt when driving.  Avoid raw meat, uncooked cheese, and liter boxes and soil used by cats.  Take your prenatal vitamins.  Take 1500-2000 milligrams of calcium daily starting at the 20th week of pregnancy until you deliver your baby.  Try taking medicine that helps you poop (stool softener) as needed, and if your doctor approves. Eat more fiber by eating fresh fruit, vegetables, and whole grains. Drink enough fluids to keep your pee (urine) clear or pale yellow.  Take warm water baths (sitz baths) to soothe pain or discomfort caused by hemorrhoids. Use hemorrhoid cream if your doctor approves.  If you have puffy, bulging veins (varicose veins), wear support hose. Raise (elevate) your feet for 15 minutes, 3-4 times a day. Limit salt in your diet.  Avoid heavy lifting, wear low heels, and sit up straight.  Rest with your legs raised if you have leg cramps or low back pain.  Visit your dentist if you have not gone during your pregnancy. Use a soft toothbrush to brush your teeth. Be gentle when you floss.  You can have sex (intercourse) unless your doctor tells you not to.  Do not travel far distances unless you must. Only do so with your doctor's approval.  Take prenatal classes.  Practice driving to the hospital.  Pack your hospital bag.  Prepare the baby's room.  Go to your doctor visits. GET HELP IF:  You are not sure if you are in labor or if your water has broken.  You are dizzy.  You have mild cramps or pressure in your lower belly (abdominal).  You have a nagging pain in your belly area.  You continue to feel sick to  your stomach (nauseous), throw up (vomit), or have watery poop (diarrhea).  You have bad smelling fluid coming from your vagina.  You have pain with peeing (urination). GET HELP RIGHT AWAY IF:   You have a fever.  You are leaking fluid from your vagina.  You are spotting or bleeding from your vagina.  You have severe belly cramping or pain.  You lose or gain weight rapidly.  You  have trouble catching your breath and have chest pain.  You notice sudden or extreme puffiness (swelling) of your face, hands, ankles, feet, or legs.  You have not felt the baby move in over an hour.  You have severe headaches that do not go away with medicine.  You have vision changes.   This information is not intended to replace advice given to you by your health care provider. Make sure you discuss any questions you have with your health care provider.   Document Released: 06/05/2009 Document Revised: 04/01/2014 Document Reviewed: 05/12/2012 Elsevier Interactive Patient Education 2016 [employer] Fetal Movement Counts Patient Name: __________________________________________________ Patient Due Date: ____________________ Performing a fetal movement count is highly recommended in high-risk pregnancies, but it is good for every pregnant woman to do. Your health care provider may ask you to start counting fetal movements at 28 weeks of the pregnancy. Fetal movements often increase:  After eating a full meal.  After physical activity.  After eating or drinking something sweet or cold.  At rest. Pay attention to when you feel the baby is most active. This will help you notice a pattern of your baby's sleep and wake cycles and what factors contribute to an increase in fetal movement. It is important to perform a fetal movement count at the same time each day when your baby is normally most active.  HOW TO COUNT FETAL MOVEMENTS 1. Find a quiet and comfortable area to sit or lie down on your left  side. Lying on your left side provides the best blood and oxygen circulation to your baby. 2. Write down the day and time on a sheet of paper or in a journal. 3. Start counting kicks, flutters, swishes, rolls, or jabs in a 2-hour period. You should feel at least 10 movements within 2 hours. 4. If you do not feel 10 movements in 2 hours, wait 2-3 hours and count again. Look for a change in the pattern or not enough counts in 2 hours. SEEK MEDICAL CARE IF:  You feel less than 10 counts in 2 hours, tried twice.  There is no movement in over an hour.  The pattern is changing or taking longer each day to reach 10 counts in 2 hours.  You feel the baby is not moving as he or she usually does. Date: ____________ Movements: ____________ Start time: ____________ Doreatha Martin time: ____________  Date: ____________ Movements: ____________ Start time: ____________ Doreatha Martin time: ____________ Date: ____________ Movements: ____________ Start time: ____________ Doreatha Martin time: ____________ Date: ____________ Movements: ____________ Start time: ____________ Doreatha Martin time: ____________ Date: ____________ Movements: ____________ Start time: ____________ Doreatha Martin time: ____________ Date: ____________ Movements: ____________ Start time: ____________ Doreatha Martin time: ____________ Date: ____________ Movements: ____________ Start time: ____________ Doreatha Martin time: ____________ Date: ____________ Movements: ____________ Start time: ____________ Doreatha Martin time: ____________  Date: ____________ Movements: ____________ Start time: ____________ Doreatha Martin time: ____________ Date: ____________ Movements: ____________ Start time: ____________ Doreatha Martin time: ____________ Date: ____________ Movements: ____________ Start time: ____________ Doreatha Martin time: ____________ Date: ____________ Movements: ____________ Start time: ____________ Doreatha Martin time: ____________ Date: ____________ Movements: ____________ Start time: ____________ Doreatha Martin time:  ____________ Date: ____________ Movements: ____________ Start time: ____________ Doreatha Martin time: ____________ Date: ____________ Movements: ____________ Start time: ____________ Doreatha Martin time: ____________  Date: ____________ Movements: ____________ Start time: ____________ Doreatha Martin time: ____________ Date: ____________ Movements: ____________ Start time: ____________ Doreatha Martin time: ____________ Date: ____________ Movements: ____________ Start time: ____________ Doreatha Martin time: ____________ Date: ____________ Movements: ____________ Start time: ____________ Doreatha Martin time: ____________ Date: ____________ Movements: ____________ Start time: ____________  Finish time: ____________ Date: ____________ Movements: ____________ Start time: ____________ Doreatha Martin time: ____________ Date: ____________ Movements: ____________ Start time: ____________ Doreatha Martin time: ____________  Date: ____________ Movements: ____________ Start time: ____________ Doreatha Martin time: ____________ Date: ____________ Movements: ____________ Start time: ____________ Doreatha Martin time: ____________ Date: ____________ Movements: ____________ Start time: ____________ Doreatha Martin time: ____________ Date: ____________ Movements: ____________ Start time: ____________ Doreatha Martin time: ____________ Date: ____________ Movements: ____________ Start time: ____________ Doreatha Martin time: ____________ Date: ____________ Movements: ____________ Start time: ____________ Doreatha Martin time: ____________ Date: ____________ Movements: ____________ Start time: ____________ Doreatha Martin time: ____________  Date: ____________ Movements: ____________ Start time: ____________ Doreatha Martin time: ____________ Date: ____________ Movements: ____________ Start time: ____________ Doreatha Martin time: ____________ Date: ____________ Movements: ____________ Start time: ____________ Doreatha Martin time: ____________ Date: ____________ Movements: ____________ Start time: ____________ Doreatha Martin time: ____________ Date: ____________ Movements:  ____________ Start time: ____________ Doreatha Martin time: ____________ Date: ____________ Movements: ____________ Start time: ____________ Doreatha Martin time: ____________ Date: ____________ Movements: ____________ Start time: ____________ Doreatha Martin time: ____________  Date: ____________ Movements: ____________ Start time: ____________ Doreatha Martin time: ____________ Date: ____________ Movements: ____________ Start time: ____________ Doreatha Martin time: ____________ Date: ____________ Movements: ____________ Start time: ____________ Doreatha Martin time: ____________ Date: ____________ Movements: ____________ Start time: ____________ Doreatha Martin time: ____________ Date: ____________ Movements: ____________ Start time: ____________ Doreatha Martin time: ____________ Date: ____________ Movements: ____________ Start time: ____________ Doreatha Martin time: ____________ Date: ____________ Movements: ____________ Start time: ____________ Doreatha Martin time: ____________  Date: ____________ Movements: ____________ Start time: ____________ Doreatha Martin time: ____________ Date: ____________ Movements: ____________ Start time: ____________ Doreatha Martin time: ____________ Date: ____________ Movements: ____________ Start time: ____________ Doreatha Martin time: ____________ Date: ____________ Movements: ____________ Start time: ____________ Doreatha Martin time: ____________ Date: ____________ Movements: ____________ Start time: ____________ Doreatha Martin time: ____________ Date: ____________ Movements: ____________ Start time: ____________ Doreatha Martin time: ____________ Date: ____________ Movements: ____________ Start time: ____________ Doreatha Martin time: ____________  Date: ____________ Movements: ____________ Start time: ____________ Doreatha Martin time: ____________ Date: ____________ Movements: ____________ Start time: ____________ Doreatha Martin time: ____________ Date: ____________ Movements: ____________ Start time: ____________ Doreatha Martin time: ____________ Date: ____________ Movements: ____________ Start time: ____________ Doreatha Martin  time: ____________ Date: ____________ Movements: ____________ Start time: ____________ Doreatha Martin time: ____________ Date: ____________ Movements: ____________ Start time: ____________ Doreatha Martin time: ____________   This information is not intended to replace advice given to you by your health care provider. Make sure you discuss any questions you have with your health care provider.   Document Released: 04/10/2006 Document Revised: 04/01/2014 Document Reviewed: 01/06/2012 Elsevier Interactive Patient Education 2016 [employer] of the uterus can occur throughout pregnancy. Contractions are not always a sign that you are in labor.  WHAT ARE BRAXTON HICKS CONTRACTIONS?  Contractions that occur before labor are called Braxton Hicks contractions, or false labor. Toward the end of pregnancy (32-34 weeks), these contractions can develop more often and may become more forceful. This is not true labor because these contractions do not result in opening (dilatation) and thinning of the cervix. They are sometimes difficult to tell apart from true labor because these contractions can be forceful and people have different pain tolerances. You should not feel embarrassed if you go to the hospital with false labor. Sometimes, the only way to tell if you are in true labor is for your health care provider to look for changes in the cervix. If there are no prenatal problems or other health problems associated with the pregnancy, it is completely safe to be sent home with false labor and await the onset of true labor. HOW CAN YOU TELL THE DIFFERENCE BETWEEN TRUE AND  FALSE LABOR? False Labor  The contractions of false labor are usually shorter and not as hard as those of true labor.   The contractions are usually irregular.   The contractions are often felt in the front of the lower abdomen and in the groin.   The contractions may go away when you walk around or change  positions while lying down.   The contractions get weaker and are shorter lasting as time goes on.   The contractions do not usually become progressively stronger, regular, and closer together as with true labor.  True Labor  Contractions in true labor last 30-70 seconds, become very regular, usually become more intense, and increase in frequency.   The contractions do not go away with walking.   The discomfort is usually felt in the top of the uterus and spreads to the lower abdomen and low back.   True labor can be determined by your health care provider with an exam. This will show that the cervix is dilating and getting thinner.  WHAT TO REMEMBER  Keep up with your usual exercises and follow other instructions given by your health care provider.   Take medicines as directed by your health care provider.   Keep your regular prenatal appointments.   Eat and drink lightly if you think you are going into labor.   If Braxton Hicks contractions are making you uncomfortable:   Change your position from lying down or resting to walking, or from walking to resting.   Sit and rest in a tub of warm water.   Drink 2-3 glasses of water. Dehydration may cause these contractions.   Do slow and deep breathing several times an hour.  WHEN SHOULD I SEEK IMMEDIATE MEDICAL CARE? Seek immediate medical care if:  Your contractions become stronger, more regular, and closer together.   You have fluid leaking or gushing from your vagina.   You have a fever.   You pass blood-tinged mucus.   You have vaginal bleeding.   You have continuous abdominal pain.   You have low back pain that you never had before.   You feel your baby's head pushing down and causing pelvic pressure.   Your baby is not moving as much as it used to.    This information is not intended to replace advice given to you by your health care provider. Make sure you discuss any questions you  have with your health care provider.   Document Released: 03/11/2005 Document Revised: 03/16/2013 Document Reviewed: 12/21/2012 Elsevier Interactive Patient Education [employer]

## 2015-12-07 ENCOUNTER — Ambulatory Visit (INDEPENDENT_AMBULATORY_CARE_PROVIDER_SITE_OTHER): Payer: Medicaid Other | Admitting: Advanced Practice Midwife

## 2015-12-07 VITALS — BP 140/80 | HR 96 | Wt 192.0 lb

## 2015-12-07 DIAGNOSIS — Z331 Pregnant state, incidental: Secondary | ICD-10-CM

## 2015-12-07 DIAGNOSIS — Z3A39 39 weeks gestation of pregnancy: Secondary | ICD-10-CM

## 2015-12-07 DIAGNOSIS — Z3493 Encounter for supervision of normal pregnancy, unspecified, third trimester: Secondary | ICD-10-CM

## 2015-12-07 DIAGNOSIS — Z1389 Encounter for screening for other disorder: Secondary | ICD-10-CM

## 2015-12-07 DIAGNOSIS — Z3483 Encounter for supervision of other normal pregnancy, third trimester: Secondary | ICD-10-CM

## 2015-12-07 LAB — POCT URINALYSIS DIPSTICK
GLUCOSE UA: NEGATIVE
Ketones, UA: NEGATIVE
Leukocytes, UA: NEGATIVE
Nitrite, UA: NEGATIVE
Protein, UA: NEGATIVE
RBC UA: NEGATIVE

## 2015-12-07 MED ORDER — ONDANSETRON HCL 4 MG PO TABS: 4.0000 mg | ORAL_TABLET | Freq: Four times a day (QID) | ORAL | 0 refills | Status: DC | PRN

## 2015-12-07 MED ORDER — CYCLOBENZAPRINE HCL 10 MG PO TABS: 10.0000 mg | ORAL_TABLET | Freq: Three times a day (TID) | ORAL | 0 refills | Status: DC | PRN

## 2015-12-07 NOTE — Progress Notes (Signed)
.  192

## 2015-12-07 NOTE — Patient Instructions (Addendum)
AM I IN LABOR? What is labor? Labor is the work that your body does to birth your baby. Your uterus (the womb) contracts. Your cervix (the mouth of the uterus) opens. You will push your baby out into the world.  What do contractions (labor pains) feel like? When they first start, contractions usually feel like cramps during your period. Sometimes you feel pain in your back. Most often, contractions feel like muscles pulling painfully in your lower belly. At first, the contractions will probably be 15 to 20 minutes apart. They will not feel too painful. As labor goes on, the contractions get stronger, closer together, and more painful.  How do I time the contractions? Time your contractions by counting the number of minutes from the start of one contraction to the start of the next contraction.  What should I do when the contractions start? If it is night and you can sleep, sleep. If it happens during the day, here are some things you can do to take care of yourself at home: ? Walk. If the pains you are having are real labor, walking will make the contractions come faster and harder. If the contractions are not going to continue and be real labor, walking will make the contractions slow down. ? Take a shower or bath. This will help you relax. ? Eat. Labor is a big event. It takes a lot of energy. ? Drink water. Not drinking enough water can cause false labor (contractions that hurt but do not open your cervix). If this is true labor, drinking water will help you have strength to get through your labor. ? Take a nap. Get all the rest you can. ? Get a massage. If your labor is in your back, a strong massage on your lower back may feel very good. Getting a foot massage is always good. ? Don't panic. You can do this. Your body was made for this. You are strong!  When should I go to the hospital or call my health care provider? ? Your contractions have been 5 minutes apart or less for at least 1  hour. ? If several contractions are so painful you cannot walk or talk during one. ? Your bag of waters breaks. (You may have a big gush of water or just water that runs down your legs when you walk.)  Are there other reasons to call my health care provider? Yes, you should call your health care provider or go to the hospital if you start to bleed like you are having a period- blood that soaks your underwear or runs down your legs, if you have sudden severe pain, if your baby has not moved for several hours, or if you are leaking green fluid. The rule is as follows: If you are very concerned about something, call.   Hypertension During Pregnancy Hypertension, or high blood pressure, is when there is extra pressure inside your blood vessels that carry blood from the heart to the rest of your body (arteries). It can happen at any time in life, including pregnancy. Hypertension during pregnancy can cause problems for you and your baby. Your baby might not weigh as much as he or she should at birth or might be born early (premature). Very bad cases of hypertension during pregnancy can be life-threatening.  Different types of hypertension can occur during pregnancy. These include:  Chronic hypertension. This happens when a woman has hypertension before pregnancy and it continues during pregnancy.  Gestational hypertension. This is when   hypertension develops during pregnancy.  Preeclampsia or toxemia of pregnancy. This is a very serious type of hypertension that develops only during pregnancy. It affects the whole body and can be very dangerous for both mother and baby.  Gestational hypertension and preeclampsia usually go away after your baby is born. Your blood pressure will likely stabilize within 6 weeks. Women who have hypertension during pregnancy have a greater chance of developing hypertension later in life or with future pregnancies. RISK FACTORS There are certain factors that make it more  likely for you to develop hypertension during pregnancy. These include:  Having hypertension before pregnancy.  Having hypertension during a previous pregnancy.  Being overweight.  Being older than 40 years.  Being pregnant with more than one baby.  Having diabetes or kidney problems. SIGNS AND SYMPTOMS Chronic and gestational hypertension rarely cause symptoms. Preeclampsia has symptoms, which may include:  Increased protein in your urine. Your health care provider will check for this at every prenatal visit.  Swelling of your hands and face.  Rapid weight gain.  Headaches.  Visual changes.  Being bothered by light.  Abdominal pain, especially in the upper right area.  Chest pain.  Shortness of breath.  Increased reflexes.  Seizures. These occur with a more severe form of preeclampsia, called eclampsia. DIAGNOSIS  You may be diagnosed with hypertension during a regular prenatal exam. At each prenatal visit, you may have:  Your blood pressure checked.  A urine test to check for protein in your urine. The type of hypertension you are diagnosed with depends on when you developed it. It also depends on your specific blood pressure reading.  Developing hypertension before 20 weeks of pregnancy is consistent with chronic hypertension.  Developing hypertension after 20 weeks of pregnancy is consistent with gestational hypertension.  Hypertension with increased urinary protein is diagnosed as preeclampsia.  Blood pressure measurements that stay above 160 systolic or 110 diastolic are a sign of severe preeclampsia. TREATMENT Treatment for hypertension during pregnancy varies. Treatment depends on the type of hypertension and how serious it is.  If you take medicine for chronic hypertension, you may need to switch medicines.  Medicines called ACE inhibitors should not be taken during pregnancy.  Low-dose aspirin may be suggested for women who have risk factors for  preeclampsia.  If you have gestational hypertension, you may need to take a blood pressure medicine that is safe during pregnancy. Your health care provider will recommend the correct medicine.  If you have severe preeclampsia, you may need to be in the hospital. Health care providers will watch you and your baby very closely. You also may need to take medicine called magnesium sulfate to prevent seizures and lower blood pressure.  Sometimes, an early delivery is needed. This may be the case if the condition worsens. It would be done to protect you and your baby. The only cure for preeclampsia is delivery.  Your health care provider may recommend that you take one low-dose aspirin (81 mg) each day to help prevent high blood pressure during your pregnancy if you are at risk for preeclampsia. You may be at risk for preeclampsia if:  You had preeclampsia or eclampsia during a previous pregnancy.  Your baby did not grow as expected during a previous pregnancy.  You experienced preterm birth with a previous pregnancy.  You experienced a separation of the placenta from the uterus (placental abruption) during a previous pregnancy.  You experienced the loss of your baby during a previous pregnancy.    You are pregnant with more than one baby.  You have other medical conditions, such as diabetes or an autoimmune disease. HOME CARE INSTRUCTIONS  Schedule and keep all of your regular prenatal care appointments. This is important.  Take medicines only as directed by your health care provider. Tell your health care provider about all medicines you take.  Eat as little salt as possible.  Get regular exercise.  Do not drink alcohol.  Do not use tobacco products.  Do not drink products with caffeine.  Lie on your left side when resting. SEEK IMMEDIATE MEDICAL CARE IF:  You have severe abdominal pain.  You have sudden swelling in your hands, ankles, or face.  You gain 4 pounds (1.8 kg) or  more in 1 week.  You vomit repeatedly.  You have vaginal bleeding.  You do not feel your baby moving as much.  You have a headache.  You have blurred or double vision.  You have muscle twitching or spasms.  You have shortness of breath.  You have blue fingernails or lips.  You have blood in your urine. MAKE SURE YOU:  Understand these instructions.  Will watch your condition.  Will get help right away if you are not doing well or get worse.   This information is not intended to replace advice given to you by your health care provider. Make sure you discuss any questions you have with your health care provider.   Document Released: 11/27/2010 Document Revised: 04/01/2014 Document Reviewed: 10/08/2012 Elsevier Interactive Patient Education 2016 [employer] Inc.  

## 2015-12-07 NOTE — Progress Notes (Signed)
G2P0010 9253w5d Estimated Date of Delivery: 12/16/15  Blood pressure 140/80, pulse 96, weight 192 lb (87.1 kg), last menstrual period 03/11/2015, unknown if currently breastfeeding.   BP weight and urine results all reviewed and noted.  Please refer to the obstetrical flow sheet for the fundal height and fetal heart rate documentation:  Patient reports good fetal movement, denies any bleeding and no rupture of membranes symptoms or regular contractions. Patient is without complaints. Denies HA/RUQ pain/vision changes.   All questions were answered.  Orders Placed This Encounter  Procedures  . POCT urinalysis dipstick    Plan:  Continued routine obstetrical care, GHTN percautions   Return in about 4 days (around 12/11/2015) for LROB.

## 2015-12-11 ENCOUNTER — Ambulatory Visit (INDEPENDENT_AMBULATORY_CARE_PROVIDER_SITE_OTHER): Payer: Medicaid Other | Admitting: Women's Health

## 2015-12-11 ENCOUNTER — Encounter: Payer: Self-pay | Admitting: Women's Health

## 2015-12-11 VITALS — BP 130/78 | HR 84 | Wt 197.0 lb

## 2015-12-11 DIAGNOSIS — Z3483 Encounter for supervision of other normal pregnancy, third trimester: Secondary | ICD-10-CM

## 2015-12-11 DIAGNOSIS — Z1389 Encounter for screening for other disorder: Secondary | ICD-10-CM

## 2015-12-11 DIAGNOSIS — O368130 Decreased fetal movements, third trimester, not applicable or unspecified: Secondary | ICD-10-CM | POA: Diagnosis not present

## 2015-12-11 DIAGNOSIS — Z3493 Encounter for supervision of normal pregnancy, unspecified, third trimester: Secondary | ICD-10-CM

## 2015-12-11 DIAGNOSIS — Z23 Encounter for immunization: Secondary | ICD-10-CM | POA: Diagnosis not present

## 2015-12-11 DIAGNOSIS — Z331 Pregnant state, incidental: Secondary | ICD-10-CM | POA: Diagnosis not present

## 2015-12-11 DIAGNOSIS — Z3A40 40 weeks gestation of pregnancy: Secondary | ICD-10-CM

## 2015-12-11 LAB — POCT URINALYSIS DIPSTICK
GLUCOSE UA: NEGATIVE
Glucose, UA: NEGATIVE
KETONES UA: NEGATIVE
NITRITE UA: NEGATIVE
Protein, UA: NEGATIVE
Protein, UA: NEGATIVE
RBC UA: NEGATIVE

## 2015-12-11 NOTE — Progress Notes (Signed)
Low-risk OB appointment G2P0010 7359w2d Estimated Date of Delivery: 12/16/15 BP 130/78   Pulse 84   Wt 197 lb (89.4 kg)   LMP 03/11/2015 (Exact Date)   BMI 28.27 kg/m   BP, weight, and urine reviewed.  Refer to obstetrical flow sheet for FH & FHR.  Reports decreased fm today- states baby was up all night so thinks he's just sleepy today- is feeling some movement- just not as much as he normally does.  Denies regular uc's, lof, vb, or uti s/s. Had headache last night apap didn't help, but ha did eventually go away. 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 Denies visual changes, ruq/epigastric pain, n/v. BP was slightly elevated 9/14- states she was w/ her mom that day and they had been fighting right before they took her bp.  DTRs 2+, no clonus, no swelling Declines SVE NST: reactive/Cat 1 Reviewed labor s/s, fkc. Plan:  Continue routine obstetrical care  F/U in 3d for OB appointment to monitor bp

## 2015-12-11 NOTE — Patient Instructions (Addendum)
Call the office [phone]) or go to [facility] if:  You begin to have strong, frequent contractions  Your water breaks.  Sometimes it is a big gush of fluid, sometimes it is just a trickle that keeps getting your panties wet or running down your legs  You have vaginal bleeding.  It is normal to have a small amount of spotting if your cervix was checked.   You don't feel your baby moving like normal.  If you don't, get you something to eat and drink and lay down and focus on feeling your baby move.  You should feel at least 10 movements in 2 hours.  If you don't, you should call the office or go to [facility].    Call the office [phone]) or go to [facility] for these signs of pre-eclampsia:  Severe headache that does not go away with Tylenol  Visual changes- seeing spots, double, blurred vision  Pain under your right breast or upper abdomen that does not go away with Tums or heartburn medicine  Nausea and/or vomiting  Severe swelling in your hands, feet, and face   [place] Pediatricians/Family Doctors:  [facility] [phone]            [facility] [phone]                 [facility] [phone] (usually not accepting new patients unless you have family there already, you are always welcome to call and ask)            [facility] ([address] [place]) [phone]   [facility]:   [facility]: [phone]  [facility]: [phone]       Deberah PeltonBraxton Hicks Contractions Contractions of the uterus can occur throughout pregnancy. Contractions are not always a sign that you are in labor.  WHAT ARE BRAXTON HICKS CONTRACTIONS?  Contractions that occur before labor are called Braxton Hicks contractions, or false labor. Toward the end of pregnancy (32-34 weeks), these contractions can develop more often and may become more forceful. This is not  true labor because these contractions do not result in opening (dilatation) and thinning of the cervix. They are sometimes difficult to tell apart from true labor because these contractions can be forceful and people have different pain tolerances. You should not feel embarrassed if you go to the hospital with false labor. Sometimes, the only way to tell if you are in true labor is for your health care provider to look for changes in the cervix. If there are no prenatal problems or other health problems associated with the pregnancy, it is completely safe to be sent home with false labor and await the onset of true labor. HOW CAN YOU TELL THE DIFFERENCE BETWEEN TRUE AND FALSE LABOR? False Labor  The contractions of false labor are usually shorter and not as hard as those of true labor.   The contractions are usually irregular.   The contractions are often felt in the front of the lower abdomen and in the groin.   The contractions may go away when you walk around or change positions while lying down.   The contractions get weaker and are shorter lasting as time goes on.   The contractions do not usually become progressively stronger, regular, and closer together as with true labor.  True Labor  Contractions in true labor last 30-70 seconds, become very regular, usually become more intense, and increase in frequency.   The  contractions do not go away with walking.   The discomfort is usually felt in the top of the uterus and spreads to the lower abdomen and low back.   True labor can be determined by your health care provider with an exam. This will show that the cervix is dilating and getting thinner.  WHAT TO REMEMBER  Keep up with your usual exercises and follow other instructions given by your health care provider.   Take medicines as directed by your health care provider.   Keep your regular prenatal appointments.   Eat and drink lightly if you think you are going  into labor.   If Braxton Hicks contractions are making you uncomfortable:   Change your position from lying down or resting to walking, or from walking to resting.   Sit and rest in a tub of warm water.   Drink 2-3 glasses of water. Dehydration may cause these contractions.   Do slow and deep breathing several times an hour.  WHEN SHOULD I SEEK IMMEDIATE MEDICAL CARE? Seek immediate medical care if:  Your contractions become stronger, more regular, and closer together.   You have fluid leaking or gushing from your vagina.   You have a fever.   You pass blood-tinged mucus.   You have vaginal bleeding.   You have continuous abdominal pain.   You have low back pain that you never had before.   You feel your baby's head pushing down and causing pelvic pressure.   Your baby is not moving as much as it used to.    This information is not intended to replace advice given to you by your health care provider. Make sure you discuss any questions you have with your health care provider.   Document Released: 03/11/2005 Document Revised: 03/16/2013 Document Reviewed: 12/21/2012 Elsevier Interactive Patient Education [employer]

## 2015-12-12 ENCOUNTER — Inpatient Hospital Stay (HOSPITAL_COMMUNITY): Payer: Medicaid Other | Admitting: Anesthesiology

## 2015-12-12 ENCOUNTER — Encounter (HOSPITAL_COMMUNITY): Payer: Self-pay | Admitting: *Deleted

## 2015-12-12 ENCOUNTER — Inpatient Hospital Stay (HOSPITAL_COMMUNITY)
Admission: AD | Admit: 2015-12-12 | Discharge: 2015-12-14 | DRG: 775 | Disposition: A | Discharge status: Home / Self Care | Payer: Medicaid Other | Source: Ambulatory Visit | Attending: Obstetrics and Gynecology | Admitting: Obstetrics and Gynecology

## 2015-12-12 ENCOUNTER — Ambulatory Visit (INDEPENDENT_AMBULATORY_CARE_PROVIDER_SITE_OTHER): Payer: Medicaid Other | Admitting: Obstetrics & Gynecology

## 2015-12-12 ENCOUNTER — Encounter: Payer: Self-pay | Admitting: Obstetrics & Gynecology

## 2015-12-12 VITALS — BP 140/80 | HR 78 | Wt 197.0 lb

## 2015-12-12 DIAGNOSIS — O134 Gestational [pregnancy-induced] hypertension without significant proteinuria, complicating childbirth: Principal | ICD-10-CM | POA: Diagnosis present

## 2015-12-12 DIAGNOSIS — IMO0001 Reserved for inherently not codable concepts without codable children: Secondary | ICD-10-CM

## 2015-12-12 DIAGNOSIS — Z3A40 40 weeks gestation of pregnancy: Secondary | ICD-10-CM | POA: Diagnosis not present

## 2015-12-12 DIAGNOSIS — F17210 Nicotine dependence, cigarettes, uncomplicated: Secondary | ICD-10-CM | POA: Diagnosis present

## 2015-12-12 DIAGNOSIS — Z8249 Family history of ischemic heart disease and other diseases of the circulatory system: Secondary | ICD-10-CM | POA: Diagnosis not present

## 2015-12-12 DIAGNOSIS — Z3483 Encounter for supervision of other normal pregnancy, third trimester: Secondary | ICD-10-CM | POA: Diagnosis not present

## 2015-12-12 DIAGNOSIS — O99334 Smoking (tobacco) complicating childbirth: Secondary | ICD-10-CM | POA: Diagnosis present

## 2015-12-12 DIAGNOSIS — Z3493 Encounter for supervision of normal pregnancy, unspecified, third trimester: Secondary | ICD-10-CM

## 2015-12-12 DIAGNOSIS — O139 Gestational [pregnancy-induced] hypertension without significant proteinuria, unspecified trimester: Secondary | ICD-10-CM | POA: Diagnosis present

## 2015-12-12 DIAGNOSIS — Z3A39 39 weeks gestation of pregnancy: Secondary | ICD-10-CM | POA: Diagnosis not present

## 2015-12-12 DIAGNOSIS — Z331 Pregnant state, incidental: Secondary | ICD-10-CM

## 2015-12-12 DIAGNOSIS — O358XX0 Maternal care for other (suspected) fetal abnormality and damage, not applicable or unspecified: Secondary | ICD-10-CM | POA: Diagnosis present

## 2015-12-12 DIAGNOSIS — Z1389 Encounter for screening for other disorder: Secondary | ICD-10-CM

## 2015-12-12 HISTORY — DX: Other specified health status: Z78.9

## 2015-12-12 HISTORY — DX: Headache: R51

## 2015-12-12 HISTORY — DX: Essential (primary) hypertension: I10

## 2015-12-12 HISTORY — DX: Anxiety disorder, unspecified: F41.9

## 2015-12-12 HISTORY — DX: Headache, unspecified: R51.9

## 2015-12-12 HISTORY — DX: Gestational (pregnancy-induced) hypertension without significant proteinuria, unspecified trimester: O13.9

## 2015-12-12 LAB — COMPREHENSIVE METABOLIC PANEL
ALT: 14 U/L (ref 14–54)
AST: 19 U/L (ref 15–41)
Albumin: 3.2 g/dL — ABNORMAL LOW (ref 3.5–5.0)
Alkaline Phosphatase: 142 U/L — ABNORMAL HIGH (ref 38–126)
Anion gap: 9 (ref 5–15)
BUN: 5 mg/dL — AB (ref 6–20)
CO2: 18 mmol/L — AB (ref 22–32)
Calcium: 9.1 mg/dL (ref 8.9–10.3)
Chloride: 110 mmol/L (ref 101–111)
Creatinine, Ser: 0.5 mg/dL (ref 0.44–1.00)
GLUCOSE: 100 mg/dL — AB (ref 65–99)
POTASSIUM: 3.4 mmol/L — AB (ref 3.5–5.1)
SODIUM: 137 mmol/L (ref 135–145)
TOTAL PROTEIN: 6 g/dL — AB (ref 6.5–8.1)
Total Bilirubin: 0.6 mg/dL (ref 0.3–1.2)

## 2015-12-12 LAB — PROTEIN / CREATININE RATIO, URINE
Creatinine, Urine: 40.53 mg/dL
Protein Creatinine Ratio: 0.17 mg/mg{Cre} — ABNORMAL HIGH (ref 0.00–0.15)
Total Protein, Urine: 7 mg/dL

## 2015-12-12 LAB — CBC
HCT: 35.8 % — ABNORMAL LOW (ref 36.0–46.0)
Hemoglobin: 12.7 g/dL (ref 12.0–15.0)
MCH: 31.8 pg (ref 26.0–34.0)
MCHC: 35.5 g/dL (ref 30.0–36.0)
MCV: 89.7 fL (ref 78.0–100.0)
PLATELETS: 265 10*3/uL (ref 150–400)
RBC: 3.99 MIL/uL (ref 3.87–5.11)
RDW: 12.7 % (ref 11.5–15.5)
WBC: 16.7 10*3/uL — AB (ref 4.0–10.5)

## 2015-12-12 LAB — POCT URINALYSIS DIPSTICK
Glucose, UA: NEGATIVE
Ketones, UA: NEGATIVE
Leukocytes, UA: NEGATIVE
NITRITE UA: NEGATIVE
Protein, UA: NEGATIVE
RBC UA: NEGATIVE

## 2015-12-12 LAB — ABO/RH: ABO/RH(D): B POS

## 2015-12-12 LAB — TYPE AND SCREEN
ABO/RH(D): B POS
Antibody Screen: NEGATIVE

## 2015-12-12 MED ORDER — EPHEDRINE 5 MG/ML INJ: 10.0000 mg | INTRAVENOUS | Status: DC | PRN

## 2015-12-12 MED ORDER — ONDANSETRON HCL 4 MG/2ML IJ SOLN: 4.0000 mg | Freq: Four times a day (QID) | INTRAMUSCULAR | Status: DC | PRN

## 2015-12-12 MED ORDER — LACTATED RINGERS IV SOLN
INTRAVENOUS | Status: DC
  Administered 2015-12-12: 19:00:00 via INTRAVENOUS

## 2015-12-12 MED ORDER — LACTATED RINGERS IV SOLN
500.0000 mL | Freq: Once | INTRAVENOUS | Status: AC
  Administered 2015-12-12: 21:00:00 via INTRAVENOUS

## 2015-12-12 MED ORDER — FENTANYL CITRATE (PF) 100 MCG/2ML IJ SOLN
50.0000 ug | INTRAMUSCULAR | Status: DC | PRN
  Administered 2015-12-12: 50 ug via INTRAVENOUS
  Filled 2015-12-12: qty 2

## 2015-12-12 MED ORDER — LACTATED RINGERS IV SOLN: 500.0000 mL | INTRAVENOUS | Status: DC | PRN

## 2015-12-12 MED ORDER — DIPHENHYDRAMINE HCL 50 MG/ML IJ SOLN
12.5000 mg | INTRAMUSCULAR | Status: DC | PRN
Start: 2015-12-12 — End: 2015-12-13
  Administered 2015-12-13: 12.5 mg via INTRAVENOUS
  Filled 2015-12-12: qty 1

## 2015-12-12 MED ORDER — LIDOCAINE HCL (PF) 1 % IJ SOLN
30.0000 mL | INTRAMUSCULAR | Status: DC | PRN
  Filled 2015-12-12: qty 30

## 2015-12-12 MED ORDER — OXYTOCIN BOLUS FROM INFUSION
500.0000 mL | Freq: Once | INTRAVENOUS | Status: AC
  Administered 2015-12-13: 500 mL via INTRAVENOUS

## 2015-12-12 MED ORDER — ACETAMINOPHEN 325 MG PO TABS: 650.0000 mg | ORAL_TABLET | ORAL | Status: DC | PRN

## 2015-12-12 MED ORDER — FENTANYL 2.5 MCG/ML BUPIVACAINE 1/10 % EPIDURAL INFUSION (WH - ANES)
14.0000 mL/h | INTRAMUSCULAR | Status: DC | PRN
  Administered 2015-12-12: 14 mL/h via EPIDURAL
  Filled 2015-12-12 (×2): qty 125

## 2015-12-12 MED ORDER — OXYTOCIN 40 UNITS IN LACTATED RINGERS INFUSION - SIMPLE MED
2.5000 [IU]/h | INTRAVENOUS | Status: DC
Start: 2015-12-12 — End: 2015-12-13
  Filled 2015-12-12: qty 1000

## 2015-12-12 MED ORDER — LACTATED RINGERS IV SOLN
INTRAVENOUS | Status: DC
  Administered 2015-12-12 – 2015-12-13 (×2): via INTRAVENOUS

## 2015-12-12 MED ORDER — PHENYLEPHRINE 40 MCG/ML (10ML) SYRINGE FOR IV PUSH (FOR BLOOD PRESSURE SUPPORT)
80.0000 ug | PREFILLED_SYRINGE | INTRAVENOUS | Status: DC | PRN
  Filled 2015-12-12: qty 10

## 2015-12-12 MED ORDER — SOD CITRATE-CITRIC ACID 500-334 MG/5ML PO SOLN: 30.0000 mL | ORAL | Status: DC | PRN

## 2015-12-12 MED ORDER — FENTANYL CITRATE (PF) 100 MCG/2ML IJ SOLN
100.0000 ug | Freq: Once | INTRAMUSCULAR | Status: AC
  Administered 2015-12-12: 100 ug via INTRAVENOUS
  Filled 2015-12-12: qty 2

## 2015-12-12 NOTE — Progress Notes (Signed)
Blood pressure 140/80, pulse 78, weight 197 lb (89.4 kg), last menstrual period 03/11/2015, unknown if currently breastfeeding.   [employer] Visit:   9432w3d Estimated Date of Delivery: 12/16/15   Chief Complaint  Patient presents with  . work-in-ob    contractions   Reports started in middle of night and has progressed, now 5-6 minutes apart No LOF No bleeding some spotting  SVE 4.5/80/-2 Membranes stripped  [facility] and likely, if her contractions persist, admission for labor     Face to face time:  15 minutes  Greater than 50% of the visit time was spent in counseling and coordination of care with the patient.  The summary and outline of the counseling and care coordination is summarized in the note above.   All questions were answered.

## 2015-12-12 NOTE — Anesthesia Preprocedure Evaluation (Signed)
Anesthesia Evaluation  Patient identified by MRN, date of birth, ID band Patient awake    Reviewed: Allergy & Precautions, NPO status , Patient's Chart, lab work & pertinent test results  Airway Mallampati: II  TM Distance: >3 FB Neck ROM: Full    Dental no notable dental hx.    Pulmonary Current Smoker,    Pulmonary exam normal breath sounds clear to auscultation       Cardiovascular hypertension, Normal cardiovascular exam Rhythm:Regular Rate:Normal     Neuro/Psych  Headaches, Anxiety    GI/Hepatic negative GI ROS, Neg liver ROS,   Endo/Other  negative endocrine ROS  Renal/GU negative Renal ROS  negative genitourinary   Musculoskeletal negative musculoskeletal ROS (+)   Abdominal   Peds negative pediatric ROS (+)  Hematology  (+) anemia ,   Anesthesia Other Findings   Reproductive/Obstetrics negative OB ROS                             Anesthesia Physical Anesthesia Plan  ASA: II  Anesthesia Plan: Epidural   Post-op Pain Management:    Induction: Intravenous  Airway Management Planned: Natural Airway  Additional Equipment:   Intra-op Plan:   Post-operative Plan:   Informed Consent: I have reviewed the patients History and Physical, chart, labs and discussed the procedure including the risks, benefits and alternatives for the proposed anesthesia with the patient or authorized representative who has indicated his/her understanding and acceptance.   Dental advisory given  Plan Discussed with: CRNA  Anesthesia Plan Comments: (Informed consent obtained prior to proceeding including risk of failure, 1% risk of PDPH, risk of minor discomfort and bruising.  Discussed rare but serious complications including epidural abscess, permanent nerve injury, epidural hematoma.  Discussed alternatives to epidural analgesia and patient desires to proceed.  Timeout performed pre-procedure  verifying patient name, procedure, and platelet count.  Patient tolerated procedure well.)        Anesthesia Quick Evaluation

## 2015-12-12 NOTE — MAU Provider Note (Signed)
History     CSN: [id number]  Arrival date and time: 12/12/15 1554   Chief Complaint  Patient presents with  . Labor Eval   Dana Lam is a 25 y.o. G2P0010 at 5936w3d who presents from clinic with contractions and sent over for labor evaluation. Was 4 to 5 cm in the office. Has been feeling pelvic pressure since midnight last night. Has not had any LOF or vaginal bleeding. Endorses good fetal movement. Feeling strong contractions now every 2-13min, also feels them in her lower back.    OB History    Gravida Para Term Preterm AB Living   2       1 0   SAB TAB Ectopic Multiple Live Births   1              Past Medical History:  Diagnosis Date  . Anemia   . BV (bacterial vaginosis)   . Nausea and vomiting during pregnancy 05/11/2015    Past Surgical History:  Procedure Laterality Date  . NO PAST SURGERIES      Family History  Problem Relation Age of Onset  . Hypertension Mother   . Heart attack Maternal Grandfather   . Cancer Paternal Grandmother     stomach  . Cancer Paternal Grandfather     lung  . Bipolar disorder Brother   . Sleep apnea Brother   . Heart disease Maternal Grandmother   . Other Maternal Grandmother     blood clot  . Arthritis Maternal Grandmother   . Thyroid disease Maternal Grandmother   . Heart attack Maternal Uncle     Social History  Substance Use Topics  . Smoking status: Current Every Day Smoker    Packs/day: 0.25    Years: 5.00    Types: Cigarettes  . Smokeless tobacco: Never Used     Comment: smokes 4-5 cig daily  . Alcohol use No     Comment: occasional    Allergies:  Allergies  Allergen Reactions  . Penicillins Hives    Has patient had a PCN reaction causing immediate rash, facial/tongue/throat swelling, SOB or lightheadedness with hypotension: no Has patient had a PCN reaction causing severe rash involving mucus membranes or skin necrosis: no Has patient had a PCN reaction that required hospitalization no Has patient  had a PCN reaction occurring within the last 10 years: yes If all of the above answers are "NO", then may proceed with Cephalosporin use.     Prescriptions Prior to Admission  Medication Sig Dispense Refill Last Dose  . acetaminophen (TYLENOL) 500 MG tablet Take 1,000 mg by mouth every 6 (six) hours as needed for mild pain.    Past Week at Unknown time  . Pediatric Multiple Vit-C-FA (FLINSTONES GUMMIES OMEGA-3 DHA) CHEW Chew 2 tablets by mouth daily.    12/12/2015 at Unknown time  . cyclobenzaprine (FLEXERIL) 10 MG tablet Take 1 tablet (10 mg total) by mouth every 8 (eight) hours as needed for muscle spasms. (Patient not taking: Reported on 12/12/2015) 30 tablet 0 Not Taking  . ondansetron (ZOFRAN) 4 MG tablet Take 1 tablet (4 mg total) by mouth every 6 (six) hours as needed for nausea or vomiting. (Patient not taking: Reported on 12/12/2015) 20 tablet 0 Not Taking    Review of Systems  Constitutional: Negative for chills and fever.  Eyes: Negative for blurred vision and double vision.  Respiratory: Negative for shortness of breath.   Cardiovascular: Negative for chest pain.  Gastrointestinal: Positive for abdominal pain ("  strong contractions" b/l lower). Negative for nausea and vomiting.  Genitourinary: Negative for dysuria, frequency and urgency.  Musculoskeletal: Positive for back pain (lower back pain).  Neurological: Negative for headaches.   Physical Exam   Blood pressure 127/75, pulse 94, resp. rate 20, last menstrual period 03/11/2015, unknown if currently breastfeeding.  Physical Exam  Constitutional: She is oriented to person, place, and time. She appears well-developed and well-nourished. She appears distressed (in mild distress, breathing through contractions).  HENT:  Head: Normocephalic and atraumatic.  Cardiovascular: Normal rate, regular rhythm and normal heart sounds.   No murmur heard. Respiratory: Effort normal and breath sounds normal. No respiratory distress. She  has no wheezes.  GI: Soft. Bowel sounds are normal. There is tenderness (over b/l lower abdomen). There is no rebound and no guarding.  Neurological: She is alert and oriented to person, place, and time.  Skin: Skin is warm and dry.  Psychiatric: She has a normal mood and affect.    MAU Course  Procedures  MDM Cervical exam 5/70/-2 CBC, CMP, Protein/creatinine ratio pending  Assessment and Plan  IUP@ 39.3 gHTN with chart review revealing multiple elevated BPs - admit to L&D   Leland Her 12/12/2015, 6:01 PM    OB FELLOW MAU ATTESTATION  I have seen and examined this patient; I agree with above documentation in the resident's note. Patient is likely in latent labor, and after reviewing patient's chart, it was noted she had a few elevated BPs in past, so admission is for latent labor and IOL/augmentation of labor for [facility].   Jen Mow, DO OB Fellow 11:41 PM

## 2015-12-12 NOTE — MAU Note (Signed)
Urine sent to lab 

## 2015-12-12 NOTE — H&P (Signed)
LABOR AND DELIVERY ADMISSION HISTORY AND PHYSICAL NOTE  Dana Lam is a 25 y.o. female G2P0010 with IUP at [redacted]w[redacted]d by LMP and 7.4wk U/S presenting for IOL for gHTN. Seen in office today, was found to be 4 to 5cm, had membranes stripped and has been having strong contractions every 2 min since. She reports positive fetal movement. She denies leakage of fluid or vaginal bleeding.  Prenatal History/Complications: none  Past Medical History: Past Medical History:  Diagnosis Date  . Anemia   . BV (bacterial vaginosis)   . Nausea and vomiting during pregnancy 05/11/2015    Past Surgical History: Past Surgical History:  Procedure Laterality Date  . NO PAST SURGERIES      Obstetrical History: OB History    Gravida Para Term Preterm AB Living   2       1 0   SAB TAB Ectopic Multiple Live Births   1              Social History: Social History   Social History  . Marital status: Single    Spouse name: N/A  . Number of children: N/A  . Years of education: N/A   Social History Main Topics  . Smoking status: Current Every Day Smoker    Packs/day: 0.25    Years: 5.00    Types: Cigarettes  . Smokeless tobacco: Never Used     Comment: smokes 4-5 cig daily  . Alcohol use No     Comment: occasional  . Drug use: No  . Sexual activity: Yes    Birth control/ protection: None   Other Topics Concern  . None   Social History Narrative  . None    Family History: Family History  Problem Relation Age of Onset  . Hypertension Mother   . Heart attack Maternal Grandfather   . Cancer Paternal Grandmother     stomach  . Cancer Paternal Grandfather     lung  . Bipolar disorder Brother   . Sleep apnea Brother   . Heart disease Maternal Grandmother   . Other Maternal Grandmother     blood clot  . Arthritis Maternal Grandmother   . Thyroid disease Maternal Grandmother   . Heart attack Maternal Uncle     Allergies: Allergies  Allergen Reactions  . Penicillins Hives     Has patient had a PCN reaction causing immediate rash, facial/tongue/throat swelling, SOB or lightheadedness with hypotension: no Has patient had a PCN reaction causing severe rash involving mucus membranes or skin necrosis: no Has patient had a PCN reaction that required hospitalization no Has patient had a PCN reaction occurring within the last 10 years: yes If all of the above answers are "NO", then may proceed with Cephalosporin use.     Prescriptions Prior to Admission  Medication Sig Dispense Refill Last Dose  . acetaminophen (TYLENOL) 500 MG tablet Take 1,000 mg by mouth every 6 (six) hours as needed for mild pain.    Past Week at Unknown time  . Pediatric Multiple Vit-C-FA (FLINSTONES GUMMIES OMEGA-3 DHA) CHEW Chew 2 tablets by mouth daily.    12/12/2015 at Unknown time  . cyclobenzaprine (FLEXERIL) 10 MG tablet Take 1 tablet (10 mg total) by mouth every 8 (eight) hours as needed for muscle spasms. (Patient not taking: Reported on 12/12/2015) 30 tablet 0 Not Taking  . ondansetron (ZOFRAN) 4 MG tablet Take 1 tablet (4 mg total) by mouth every 6 (six) hours as needed for nausea or vomiting. (Patient  not taking: Reported on 12/12/2015) 20 tablet 0 Not Taking     Review of Systems   All systems reviewed and negative except as stated in HPI  Blood pressure 117/82, pulse 97, resp. rate 20, last menstrual period 03/11/2015, unknown if currently breastfeeding. General appearance: alert, cooperative and no distress Lungs: no respiratory distress Heart: regular rate Abdomen: soft, non-tender Extremities: No calf swelling or tenderness Presentation: cephalic by cervical exam Fetal monitoring: 140 baseline, moderate variability, +accelerations, no decelerations Uterine activity: q2-643min Dilation: 5 Effacement (%): 70 Station: -2 Exam by:: Morrison Oldee Carter RN   Prenatal labs: ABO, Rh: B/Positive/-- (02/16 1526) Antibody: Negative (06/29 0912) Rubella: immune RPR: Non Reactive (06/29 0912)   HBsAg: Negative (02/16 1526)  HIV: Non Reactive (06/29 0912)  GBS: Negative (08/31 0000)   Prenatal Transfer Tool  Maternal Diabetes: No Genetic Screening: Normal Maternal Ultrasounds/Referrals: Abnormal:  Findings:   Other: R minimal pyelectasis <1cm Fetal Ultrasounds or other Referrals:  None Maternal Substance Abuse:  No Significant Maternal Medications:  None Significant Maternal Lab Results: Lab values include: Group B Strep negative  Results for orders placed or performed in visit on 12/12/15 (from the past 24 hour(s))  POCT urinalysis dipstick   Collection Time: 12/12/15  2:45 PM  Result Value Ref Range   Color, UA     Clarity, UA     Glucose, UA neg    Bilirubin, UA     Ketones, UA neg    Spec Grav, UA     Blood, UA neg    pH, UA     Protein, UA neg    Urobilinogen, UA     Nitrite, UA neg    Leukocytes, UA Negative Negative    Patient Active Problem List   Diagnosis Date Noted  . Pyelectasis of fetus on prenatal ultrasound 11/24/2015  . Supervision of normal pregnancy 05/11/2015    Assessment: Dana Lam is a 25 y.o. G2P0010 at 660w3d here for IOL for gHTN  #Labor: planning for pitocin #FWB: Category I #ID:  GBS neg #MOF: bottle #MOC:mirena #Circ:  outpatient  Leland HerElsia J Yoo, DO PGY-1 9/19/20177:27 PM   OB FELLOW HISTORY AND PHYSICAL ATTESTATION  I have seen and examined this patient; I agree with above documentation in the resident's note.    Jen MowElizabeth Mumaw, DO OB Fellow 12/12/2015, 11:37 PM

## 2015-12-12 NOTE — MAU Note (Signed)
Pt has been having uc's since 0030 last night, saw Dr. Despina HiddenEure today, he stripped her membranes approx 45 minutes ago, pt is now bleeding.  States her cervix was 4-5.

## 2015-12-12 NOTE — MAU Note (Signed)
Seen in OB's office and had membranes stripped; 4-5 cm in OB's office today;

## 2015-12-13 ENCOUNTER — Encounter (HOSPITAL_COMMUNITY): Payer: Self-pay

## 2015-12-13 DIAGNOSIS — O134 Gestational [pregnancy-induced] hypertension without significant proteinuria, complicating childbirth: Secondary | ICD-10-CM

## 2015-12-13 DIAGNOSIS — Z3A39 39 weeks gestation of pregnancy: Secondary | ICD-10-CM

## 2015-12-13 DIAGNOSIS — O99334 Smoking (tobacco) complicating childbirth: Secondary | ICD-10-CM

## 2015-12-13 DIAGNOSIS — F17210 Nicotine dependence, cigarettes, uncomplicated: Secondary | ICD-10-CM

## 2015-12-13 LAB — CBC WITH DIFFERENTIAL/PLATELET
BASOS PCT: 0 %
Basophils Absolute: 0 10*3/uL (ref 0.0–0.1)
Eosinophils Absolute: 0 10*3/uL (ref 0.0–0.7)
Eosinophils Relative: 0 %
HEMATOCRIT: 33.6 % — AB (ref 36.0–46.0)
Hemoglobin: 11.6 g/dL — ABNORMAL LOW (ref 12.0–15.0)
LYMPHS ABS: 1.6 10*3/uL (ref 0.7–4.0)
Lymphocytes Relative: 7 %
MCH: 31.6 pg (ref 26.0–34.0)
MCHC: 34.5 g/dL (ref 30.0–36.0)
MCV: 91.6 fL (ref 78.0–100.0)
MONO ABS: 1.6 10*3/uL — AB (ref 0.1–1.0)
MONOS PCT: 7 %
NEUTROS ABS: 20.1 10*3/uL — AB (ref 1.7–7.7)
Neutrophils Relative %: 86 %
Platelets: 219 10*3/uL (ref 150–400)
RBC: 3.67 MIL/uL — ABNORMAL LOW (ref 3.87–5.11)
RDW: 12.7 % (ref 11.5–15.5)
WBC: 23.2 10*3/uL — AB (ref 4.0–10.5)

## 2015-12-13 LAB — RPR: RPR Ser Ql: NONREACTIVE

## 2015-12-13 MED ORDER — PRENATAL MULTIVITAMIN CH
1.0000 | ORAL_TABLET | Freq: Every day | ORAL | Status: DC
  Administered 2015-12-13 – 2015-12-14 (×2): 1 via ORAL
  Filled 2015-12-13 (×2): qty 1

## 2015-12-13 MED ORDER — OXYTOCIN 40 UNITS IN LACTATED RINGERS INFUSION - SIMPLE MED
1.0000 m[IU]/min | INTRAVENOUS | Status: DC
  Administered 2015-12-13: 2 m[IU]/min via INTRAVENOUS

## 2015-12-13 MED ORDER — ONDANSETRON HCL 4 MG/2ML IJ SOLN: 4.0000 mg | INTRAMUSCULAR | Status: DC | PRN

## 2015-12-13 MED ORDER — ACETAMINOPHEN 325 MG PO TABS
650.0000 mg | ORAL_TABLET | ORAL | Status: DC | PRN
  Administered 2015-12-13 – 2015-12-14 (×2): 650 mg via ORAL
  Filled 2015-12-13 (×2): qty 2

## 2015-12-13 MED ORDER — LIDOCAINE HCL (PF) 1 % IJ SOLN
INTRAMUSCULAR | Status: DC | PRN
  Administered 2015-12-12 (×2): 6 mL

## 2015-12-13 MED ORDER — DIBUCAINE 1 % RE OINT
1.0000 "application " | TOPICAL_OINTMENT | RECTAL | Status: DC | PRN
  Administered 2015-12-14: 1 via RECTAL
  Filled 2015-12-13: qty 28

## 2015-12-13 MED ORDER — IBUPROFEN 600 MG PO TABS
600.0000 mg | ORAL_TABLET | Freq: Four times a day (QID) | ORAL | Status: DC
  Administered 2015-12-13 – 2015-12-14 (×5): 600 mg via ORAL
  Filled 2015-12-13 (×5): qty 1

## 2015-12-13 MED ORDER — ONDANSETRON HCL 4 MG PO TABS: 4.0000 mg | ORAL_TABLET | ORAL | Status: DC | PRN

## 2015-12-13 MED ORDER — TERBUTALINE SULFATE 1 MG/ML IJ SOLN: 0.2500 mg | Freq: Once | INTRAMUSCULAR | Status: DC | PRN

## 2015-12-13 MED ORDER — BENZOCAINE-MENTHOL 20-0.5 % EX AERO
1.0000 "application " | INHALATION_SPRAY | CUTANEOUS | Status: DC | PRN
  Administered 2015-12-14: 1 via TOPICAL
  Filled 2015-12-13 (×2): qty 56

## 2015-12-13 MED ORDER — DIPHENHYDRAMINE HCL 25 MG PO CAPS: 25.0000 mg | ORAL_CAPSULE | Freq: Four times a day (QID) | ORAL | Status: DC | PRN

## 2015-12-13 MED ORDER — SENNOSIDES-DOCUSATE SODIUM 8.6-50 MG PO TABS
2.0000 | ORAL_TABLET | ORAL | Status: DC
  Administered 2015-12-13: 2 via ORAL
  Filled 2015-12-13: qty 2

## 2015-12-13 MED ORDER — LACTATED RINGERS IV SOLN: 500.0000 mL | Freq: Once | INTRAVENOUS | Status: DC

## 2015-12-13 MED ORDER — COCONUT OIL OIL: 1.0000 "application " | TOPICAL_OIL | Status: DC | PRN

## 2015-12-13 MED ORDER — SIMETHICONE 80 MG PO CHEW: 80.0000 mg | CHEWABLE_TABLET | ORAL | Status: DC | PRN

## 2015-12-13 MED ORDER — WITCH HAZEL-GLYCERIN EX PADS: 1.0000 "application " | MEDICATED_PAD | CUTANEOUS | Status: DC | PRN

## 2015-12-13 MED ORDER — TETANUS-DIPHTH-ACELL PERTUSSIS 5-2.5-18.5 LF-MCG/0.5 IM SUSP
0.5000 mL | Freq: Once | INTRAMUSCULAR | Status: AC
  Administered 2015-12-14: 0.5 mL via INTRAMUSCULAR
  Filled 2015-12-13: qty 0.5

## 2015-12-13 MED ORDER — ZOLPIDEM TARTRATE 5 MG PO TABS: 5.0000 mg | ORAL_TABLET | Freq: Every evening | ORAL | Status: DC | PRN

## 2015-12-13 NOTE — Anesthesia Procedure Notes (Signed)
Epidural Patient location during procedure: OB Start time: 12/12/2015 9:25 PM  Staffing Anesthesiologist: Sherrian DiversENENNY, Donie Lemelin  Preanesthetic Checklist Completed: patient identified, site marked, surgical consent, pre-op evaluation, timeout performed, IV checked, risks and benefits discussed and monitors and equipment checked  Epidural Patient position: sitting Prep: DuraPrep Patient monitoring: blood pressure and heart rate Approach: midline Location: L4-L5 Injection technique: LOR saline  Needle:  Needle type: Tuohy  Needle gauge: 17 G Needle length: 9 cm Needle insertion depth: 6 cm Catheter type: closed end flexible Catheter size: 19 Gauge Catheter at skin depth: 14 cm Test dose: negative and Other  Assessment Events: blood not aspirated, injection not painful, no injection resistance, negative IV test and no paresthesia  Additional Notes Reason for block:at surgeon's request and procedure for pain

## 2015-12-13 NOTE — Progress Notes (Signed)
Patient ID: Dana LaineMeagan L Lam, female   DOB: 11/07/1990, 25 y.o.   MRN: [id number]  S: Patient seen & examined for progress of labor. Comfortable with epidural. Discussed AROM with patient, who is in agreement with plan.  O:  Vitals:   12/12/15 2221 12/12/15 2231 12/12/15 2301 12/12/15 2331  BP: 127/60 134/74 132/82 131/81  Pulse: 86 85 89 96  Resp:      Temp:   99.4 F (37.4 C)   TempSrc:   Axillary   SpO2:      Weight:      Height:       Dilation: 7 Effacement (%): 80 Cervical Position: Middle Station: -1 Presentation: Vertex Exam by:: Rolene Arbouresmaine Lewis, RN  FHT: 135 bpm, mod var +accels, no decels TOCO: q2-4 min.  AROM performed, moderate mec fluid return, patient tolerated procedure well, baby's head engaged well after.  A/P: Continue expectant management Anticipate SVD.

## 2015-12-13 NOTE — Anesthesia Postprocedure Evaluation (Signed)
Anesthesia Post Note  Patient: Dana Lam  Procedure(s) Performed: * No procedures listed *  Patient location during evaluation: Mother Baby Anesthesia Type: Epidural Level of consciousness: awake and alert Pain management: pain level controlled Vital Signs Assessment: post-procedure vital signs reviewed and stable Respiratory status: spontaneous breathing, nonlabored ventilation and respiratory function stable Cardiovascular status: stable Postop Assessment: no headache, no backache and epidural receding Anesthetic complications: no     Last Vitals:  Vitals:   12/13/15 1115 12/13/15 1220  BP: (!) 105/51 126/67  Pulse: 100 92  Resp: 20 18  Temp: 37.4 C 37.3 C    Last Pain:  Vitals:   12/13/15 1220  TempSrc: Oral  PainSc:    Pain Goal: Patients Stated Pain Goal: 3 (12/13/15 0854)               Junious SilkGILBERT,Daviel Allegretto

## 2015-12-13 NOTE — Progress Notes (Signed)
Patient ID: Cathren LaineMeagan L Bye, female   DOB: 06/25/1990, 25 y.o.   MRN: [id number]   S: Assessed patient for progress of labor. Patient pushing well with good maternal effort. Epidural in place, patient feeling pressure during contractions.  O:  Vitals:   12/13/15 0600 12/13/15 0630  BP: 136/76 124/81  Pulse: 90 95  Resp:    Temp: 98.8 F (37.1 C)    Dilation: 10 Dilation Complete Date: 12/13/15 Dilation Complete Time: 0430 Effacement (%): 100 Cervical Position: Anterior Station: +1 Presentation: Vertex Exam by:: Rolene Arbouresmaine Lewis, RN Caput noted.   FHT: 140, mod var, +accels, early decels TOCO: q642min  A/P: Expectant management, anticipate SVD. Continue pushing.

## 2015-12-13 NOTE — Anesthesia Rounding Note (Signed)
  CRNA Epidural Rounding Note  Patient: Dana Lam, 25 y.o., female  Patient's current pain level: 6  Agreed upon pain management level: 4  Epidural intervention: No   Comments: Patient calm, stated she just pushed her PCEA and that pain is more sciatic nerve pain from babys position.  [facility] HospitalEIGHT,Garald Rhew 12/13/2015

## 2015-12-14 ENCOUNTER — Encounter: Payer: Medicaid Other | Admitting: Obstetrics & Gynecology

## 2015-12-14 MED ORDER — IBUPROFEN 600 MG PO TABS: 600.0000 mg | ORAL_TABLET | Freq: Four times a day (QID) | ORAL | 0 refills | Status: DC | PRN

## 2015-12-14 NOTE — Clinical Social Work Maternal (Addendum)
CLINICAL SOCIAL WORK MATERNAL/CHILD NOTE  Patient Details  Name: Dana Lam MRN: [id number] Date of Birth: Mar 05, 1991  Date:  12/14/2015  Clinical Social Worker Initiating Note:  Terri Piedra,  Date/ Time Initiated:  12/14/15/1130     Child's Name:  Dana Lam   Legal Guardian:   (Parents: Dana Lam and Dana Lam)   Need for Interpreter:  None   Date of Referral:  12/14/15     Reason for Referral:  Other (Comment) (Hx of Anxiety)   Referral Source:  [facility]   Address:  [address] [place], [place] [address]  Phone number:  [phone]   Household Members:  Significant Other   Natural Supports (not living in the home):  Immediate Family, Extended Family (Parents report that both of their families are involved and supportive.)   Professional Supports: None   Employment:     Type of Work:  (MOB states she has a job at a [employer], which MOB will start when infant is three months old.  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  He wants to become recetified as a massage therapist.)   Education:      Financial Resources:  Medicaid   Other Resources:      Cultural/Religious Considerations Which May Impact Care: None stated.   Strengths:  Ability to meet basic needs , Pediatrician chosen , Home prepared for child  (Pediatric follow up will be at [facility])   Risk Factors/Current Problems:  None   Cognitive State:  Alert , Able to Concentrate , Insightful , Linear Thinking , Goal Oriented    Mood/Affect:  Interested , Happy , Calm , Bright    CSW Assessment: CSW met with parents in MOB's first floor room/139 to complete assessment due to hx of anxiety and to offer support.  Parents were pleasant and welcoming.  MOB gave permission to talk openly with FOB present.  Parents were easy to engage and both equally involved in the  conversation. Parents report being in a positive, supportive relationship for two years.  They state they have good family support and people to call on if needed.  This is the first child for both of them.  They state they have everything they need for infant at home. CSW asked how they are feeling emotionally.  MOB states she is happy.  FOB reports that he is excited about the baby, but also notes a high level of stress.  He states that he is concerned that something will happen to baby and must keep one eye open at all times to make sure he is safe.  He states that he did not hear baby when he choked, but saw that he was struggling.  He states he is fearful about sleeping.  CSW normalized and validated FOB's concern.  CSW provided education regarding safe sleep and encouraged parents to allow themselves to sleep once they know they have put baby to bed safely.  CSW commented that hypervigilance should ease with time and to allow themselves time to adjust.  CSW provided education regarding perinatal mood disorders and stressed the importance of speaking with a professional if they are experiencing any symptoms.  Both parents were attentive and state a commitment to monitoring for signs and reporting them if they arise.   CSW inquired about any mental health history.  MOB reports that she has anxiety, but that this  is not a concern for her.  She states she has infrequent panic attacks and is able to cope with them by putting her head between her legs and taking a time out.  FOB reports that he copes with stress through prayer.  CSW reminded them to utilize these coping skills as needed while they adjust to being new parents. Parents appeared appreciative of CSW's visit and concern for their emotional wellbeing.  CSW has no concerns and identifies no further intervention needed or barriers to discharge when MOB and baby are medically ready.      CSW Plan/Description:  Patient/Family Education , No Further  Intervention Required/No Barriers to Discharge    Alphonzo Cruise, [place] 12/14/2015, 2:01 PM

## 2015-12-14 NOTE — Progress Notes (Signed)
CSW acknowledged consult for hx of anxiety and attempted to meet with MOB.  However, bedside nurse was attending to [facility].  CSW will attempt to meet with MOB prior to dc.  Blaine HamperAngel Boyd-Gilyard, MSW, LCSW Clinical Social Work [phone]

## 2015-12-14 NOTE — Progress Notes (Signed)
Psychosocial assessment completed.  No barriers to discharge identified.  Full documentation to follow. 

## 2015-12-14 NOTE — Discharge Instructions (Signed)

## 2015-12-14 NOTE — Progress Notes (Signed)
CSW attempted again to meet with MOB due to hx of Anxiety.  Baby is having hearing screen completed at this time.  CSW will attempt again at a later time if possible.

## 2015-12-14 NOTE — Discharge Summary (Signed)
OB Discharge Summary     Patient Name: Dana Lam DOB: 1990/07/05 MRN: [id number]  Date of admission: 12/12/2015 Delivering MD: Hermina Staggers   Date of discharge: 12/14/2015  Admitting diagnosis: 39w labor, ctx less that 5 min, pressure, membranes scraped Intrauterine pregnancy: [redacted]w[redacted]d     Secondary diagnosis:  Active Problems:   Gestational hypertension  Additional problems: mild fetal pyelectasis     Discharge diagnosis: Term Pregnancy Delivered and Gestational Hypertension                                                                                                Post partum procedures:none  Augmentation: AROM and Pitocin  Complications: None  Hospital course:  Induction of Labor With Vaginal Delivery     25 y.o. yo G2P1011 at [redacted]w[redacted]d was admitted with gHTN and advanced cx dilation on 12/12/2015. She was started on Pitocin and then had her membranes ruptured. Pt was offered VAVD- see Delivery Note. Patient had an uncomplicated labor course as follows:  Membrane Rupture Time/Date: 12:17 AM ,12/13/2015   Intrapartum Procedures: Episiotomy: None [1]                                         Lacerations:  2nd degree [3];Perineal [11]  Patient had a delivery of a Viable infant. 12/13/2015  Information for the patient's newborn:  Adley, Castello [[id number]]  Delivery Method: Vaginal, Vacuum (Extractor) (Filed from Delivery Summary)    Pateint had an uncomplicated postpartum course. Her BPs remained within nl limits without s/s of pre-e.  She is ambulating, tolerating a regular diet, passing flatus, and urinating well. Patient is discharged home in stable condition on 12/14/15.    Physical exam Vitals:   12/13/15 1220 12/13/15 1623 12/13/15 2357 12/14/15 0611  BP: 126/67 119/68 122/64 107/67  Pulse: 92 85 95 72  Resp: 18 18 18 18   Temp: 99.2 F (37.3 C) 98.7 F (37.1 C) 98 F (36.7 C) 97.4 F (36.3 C)  TempSrc: Oral Oral Oral Oral  SpO2: 99% 99%  100%  Weight:       Height:       General: alert and cooperative Lochia: appropriate Uterine Fundus: firm Incision: N/A DVT Evaluation: No evidence of DVT seen on physical exam. Labs: Lab Results  Component Value Date   WBC 23.2 (H) 12/13/2015   HGB 11.6 (L) 12/13/2015   HCT 33.6 (L) 12/13/2015   MCV 91.6 12/13/2015   PLT 219 12/13/2015   CMP Latest Ref Rng & Units 12/12/2015  Glucose 65 - 99 mg/dL 782(N)  BUN 6 - 20 mg/dL 5(L)  Creatinine 5.62 - 1.00 mg/dL 1.30  Sodium 865 - 784 mmol/L 137  Potassium 3.5 - 5.1 mmol/L 3.4(L)  Chloride 101 - 111 mmol/L 110  CO2 22 - 32 mmol/L 18(L)  Calcium 8.9 - 10.3 mg/dL 9.1  Total Protein 6.5 - 8.1 g/dL 6.0(L)  Total Bilirubin 0.3 - 1.2 mg/dL 0.6  Alkaline Phos 38 - 126 U/L 142(H)  AST 15 - 41 U/L 19  ALT 14 - 54 U/L 14    Discharge instruction: per After Visit Summary and "Baby and Me Booklet".  After visit meds:    Medication List    STOP taking these medications   acetaminophen 500 MG tablet Commonly known as:  TYLENOL   cyclobenzaprine 10 MG tablet Commonly known as:  FLEXERIL   ondansetron 4 MG tablet Commonly known as:  ZOFRAN     TAKE these medications   FLINSTONES GUMMIES OMEGA-3 DHA Chew Chew 2 tablets by mouth daily.   ibuprofen 600 MG tablet Commonly known as:  ADVIL,MOTRIN Take 1 tablet (600 mg total) by mouth every 6 (six) hours as needed.       Diet: routine diet  Activity: Advance as tolerated. Pelvic rest for 6 weeks.   Outpatient follow up:6 weeks  Follow up Visit:No Follow-up on file.  Postpartum contraception: IUD Mirena  Newborn Data: Live born female  Birth Weight: 7 lb 6.9 oz (3370 g) APGAR: 7, 8  Baby Feeding: Bottle Disposition:home with mother   12/14/2015 Cam HaiSHAW, KIMBERLY, CNM  9:33 AM

## 2015-12-18 ENCOUNTER — Telehealth: Payer: Self-pay | Admitting: *Deleted

## 2015-12-18 NOTE — Telephone Encounter (Signed)
Clydie BraunKaren, postpartum nurse from health dept called to report pt has a knot on right breast. Pt delivered 12/13/15. She is not breastfeeding. Pt has a wet cough. Pt's BP was 129/87 in the left arm and 128/85 in the right arm. Pt has had no fever. Breast is warm to touch. I advised she needs to be seen. Call was transferred to front desk for appt for tomorrow. JSY

## 2015-12-19 ENCOUNTER — Ambulatory Visit (INDEPENDENT_AMBULATORY_CARE_PROVIDER_SITE_OTHER): Payer: Medicaid Other | Admitting: Women's Health

## 2015-12-19 ENCOUNTER — Encounter: Payer: Self-pay | Admitting: Women's Health

## 2015-12-19 VITALS — BP 132/88 | HR 80 | Wt 177.0 lb

## 2015-12-19 DIAGNOSIS — N644 Mastodynia: Secondary | ICD-10-CM

## 2015-12-19 NOTE — Patient Instructions (Addendum)
Warm compresses, warm shower, massage, ibuprofen or tylenol for pain, then:  Cold cabbage leaves Supportive bra Avoid warm showers  Let us know if not improving or worsening, fever/chills  NO SEX UNTIL AFTER YOU GET YOUR BIRTH CONTROL

## 2015-12-19 NOTE — Progress Notes (Addendum)
[facility]   [facility]  Patient name: Dana Lam MRN [id number]  Date of birth: 09/22/1990  CC & HPI:  Dana Lam is a 25 y.o. 232P1011 Caucasian female 6d s/p SVB presenting today for report of Rt breast pain/clogged duct. Not breastfeeding. Denies fever/chills/flu-like symptoms/body aches. Has been trying cold cabbage leaves, her boyfriend has been massaging area. Plans for IUD.  No LMP recorded. Pertinent History Reviewed:  Medical & Surgical Hx:   Past medical, surgical, family, and social history reviewed in electronic medical record Medications: Reviewed & Updated - see associated section Allergies: Reviewed in electronic medical record  Objective Findings:  Vitals: BP 132/88 (BP Location: Right Arm, Patient Position: Sitting, Cuff Size: Normal)   Pulse 80   Wt 177 lb (80.3 kg)   Breastfeeding? No   BMI 25.40 kg/m  Body mass index is 25.4 kg/m.  Physical Examination: General appearance - alert, well appearing, and in no distress Breasts - Lt breast: clogged duct Left lower breast- no erythema/tenderness- pt denies pain at this area Rt breast: no palpable clogged ducts or erythema in any part of Rt breast, pt points to Rt inner breast ~ 12-3 o'clock as area of pain- no lumps/masses  No results found for this or any previous visit (from the past 24 hour(s)).   Assessment & Plan:  A:   6d s/p SVB  Bottlefeeding  Rt breast pain, no evidence of mastitis/clogged duct/abnormalities  P:  Can try warm compresses, warm showers w/ gentle massaging of area, then once area better cold cabbage leaves, supportive bra, avoid warm showers/stimulation to help dry milk  Don't over-massage or massage aggressively- pain could be from soreness of massage  APAP or Ibuprofen prn pain  Call if not improving/worsening, fever/chills, flu-like sx  Return in about 3 weeks (around 01/09/2016) for postpartum visit.  Marge DuncansBooker, Apolo Cutshaw Randall CNM, [facility]NP-BC 12/19/2015 2:16  PM

## 2016-01-09 ENCOUNTER — Ambulatory Visit (INDEPENDENT_AMBULATORY_CARE_PROVIDER_SITE_OTHER): Payer: Medicaid Other | Admitting: Women's Health

## 2016-01-09 DIAGNOSIS — Z3202 Encounter for pregnancy test, result negative: Secondary | ICD-10-CM | POA: Diagnosis not present

## 2016-01-09 DIAGNOSIS — Z8759 Personal history of other complications of pregnancy, childbirth and the puerperium: Secondary | ICD-10-CM | POA: Insufficient documentation

## 2016-01-09 LAB — POCT URINE PREGNANCY: Preg Test, Ur: NEGATIVE

## 2016-01-09 NOTE — Patient Instructions (Signed)
NO SEX UNTIL AFTER YOU GET YOUR BIRTH CONTROL   Levonorgestrel intrauterine device (IUD) What is this medicine? LEVONORGESTREL IUD (LEE voe nor jes trel) is a contraceptive (birth control) device. The device is placed inside the uterus by a healthcare professional. It is used to prevent pregnancy and can also be used to treat heavy bleeding that occurs during your period. Depending on the device, it can be used for 3 to 5 years. This medicine may be used for other purposes; ask your health care provider or pharmacist if you have questions. What should I tell my health care provider before I take this medicine? They need to know if you have any of these conditions: -abnormal Pap smear -cancer of the breast, uterus, or cervix -diabetes -endometritis -genital or pelvic infection now or in the past -have more than one sexual partner or your partner has more than one partner -heart disease -history of an ectopic or tubal pregnancy -immune system problems -IUD in place -liver disease or tumor -problems with blood clots or take blood-thinners -use intravenous drugs -uterus of unusual shape -vaginal bleeding that has not been explained -an unusual or allergic reaction to levonorgestrel, other hormones, silicone, or polyethylene, medicines, foods, dyes, or preservatives -pregnant or trying to get pregnant -breast-feeding How should I use this medicine? This device is placed inside the uterus by a health care professional. Talk to your pediatrician regarding the use of this medicine in children. Special care may be needed. Overdosage: If you think you have taken too much of this medicine contact a poison control center or emergency room at once. NOTE: This medicine is only for you. Do not share this medicine with others. What if I miss a dose? This does not apply. What may interact with this medicine? Do not take this medicine with any of the following  medications: -amprenavir -bosentan -fosamprenavir This medicine may also interact with the following medications: -aprepitant -barbiturate medicines for inducing sleep or treating seizures -bexarotene -griseofulvin -medicines to treat seizures like carbamazepine, ethotoin, felbamate, oxcarbazepine, phenytoin, topiramate -modafinil -pioglitazone -rifabutin -rifampin -rifapentine -some medicines to treat HIV infection like atazanavir, indinavir, lopinavir, nelfinavir, tipranavir, ritonavir -St. John's wort -warfarin This list may not describe all possible interactions. Give your health care provider a list of all the medicines, herbs, non-prescription drugs, or dietary supplements you use. Also tell them if you smoke, drink alcohol, or use illegal drugs. Some items may interact with your medicine. What should I watch for while using this medicine? Visit your doctor or health care professional for regular check ups. See your doctor if you or your partner has sexual contact with others, becomes HIV positive, or gets a sexual transmitted disease. This product does not protect you against HIV infection (AIDS) or other sexually transmitted diseases. You can check the placement of the IUD yourself by reaching up to the top of your vagina with clean fingers to feel the threads. Do not pull on the threads. It is a good habit to check placement after each menstrual period. Call your doctor right away if you feel more of the IUD than just the threads or if you cannot feel the threads at all. The IUD may come out by itself. You may become pregnant if the device comes out. If you notice that the IUD has come out use a backup birth control method like condoms and call your health care provider. Using tampons will not change the position of the IUD and are okay to use during your   period. What side effects may I notice from receiving this medicine? Side effects that you should report to your doctor or  health care professional as soon as possible: -allergic reactions like skin rash, itching or hives, swelling of the face, lips, or tongue -fever, flu-like symptoms -genital sores -high blood pressure -no menstrual period for 6 weeks during use -pain, swelling, warmth in the leg -pelvic pain or tenderness -severe or sudden headache -signs of pregnancy -stomach cramping -sudden shortness of breath -trouble with balance, talking, or walking -unusual vaginal bleeding, discharge -yellowing of the eyes or skin Side effects that usually do not require medical attention (report to your doctor or health care professional if they continue or are bothersome): -acne -breast pain -change in sex drive or performance -changes in weight -cramping, dizziness, or faintness while the device is being inserted -headache -irregular menstrual bleeding within first 3 to 6 months of use -nausea This list may not describe all possible side effects. Call your doctor for medical advice about side effects. You may report side effects to FDA at 1-800-FDA-1088. Where should I keep my medicine? This does not apply. NOTE: This sheet is a summary. It may not cover all possible information. If you have questions about this medicine, talk to your doctor, pharmacist, or health care provider.    2016, Elsevier/Gold Standard. (2011-04-11 13:54:04)  

## 2016-01-09 NOTE — Progress Notes (Signed)
Subjective:    Dana Lam is a [age] y.o. [id number] Caucasian female who presents for a postpartum visit. She is 4 weeks postpartum following a spontaneous vaginal delivery at 39.4 gestational weeks after IOL for GHTN. Anesthesia: epidural. I have fully reviewed the prenatal and intrapartum course. Postpartum course has been uncomplicated. Baby's course has been uncomplicated. Baby is feeding by bottle. Bleeding no bleeding. Bowel function is normal. Bladder function is normal. Patient is not sexually active. Last sexual activity: prior to birth of baby. Contraception method is wants IUD. Postpartum depression screening: negative. Score 3.  Last pap 05/11/15 and was neg.  The following portions of the patient's history were reviewed and updated as appropriate: allergies, current medications, past medical history, past surgical history and problem list.  Review of Systems Pertinent items are noted in HPI.   Vitals:   01/09/16 1112  BP: 110/68  Pulse: 68  Weight: 169 lb (76.7 kg)   No LMP recorded.  Objective:   General:  alert, cooperative and no distress   Breasts:  deferred, no complaints  Lungs: clear to auscultation bilaterally  Heart:  regular rate and rhythm  Abdomen: soft, nontender   Vulva: normal  Vagina: normal vagina  Cervix:  closed  Corpus: Well-involuted  Adnexa:  Non-palpable  Rectal Exam: No hemorrhoids        Assessment:   Postpartum exam 4 wks s/p SVB Bottlefeeding Depression screening Contraception counseling   Plan:   Contraception: abstinence until IUD Follow up in: 2wks for IUD insertion or earlier if needed  Marge DuncansBooker, Athene Schuhmacher Randall CNM, [facility] 01/09/2016 11:40 AM

## 2016-01-23 ENCOUNTER — Ambulatory Visit: Payer: Medicaid Other | Admitting: Women's Health

## 2016-01-23 ENCOUNTER — Ambulatory Visit (INDEPENDENT_AMBULATORY_CARE_PROVIDER_SITE_OTHER): Payer: Medicaid Other | Admitting: Women's Health

## 2016-01-23 ENCOUNTER — Encounter: Payer: Self-pay | Admitting: Women's Health

## 2016-01-23 VITALS — BP 120/70 | HR 82 | Wt 169.0 lb

## 2016-01-23 DIAGNOSIS — Z30430 Encounter for insertion of intrauterine contraceptive device: Secondary | ICD-10-CM

## 2016-01-23 DIAGNOSIS — Z3202 Encounter for pregnancy test, result negative: Secondary | ICD-10-CM

## 2016-01-23 DIAGNOSIS — Z30014 Encounter for initial prescription of intrauterine contraceptive device: Secondary | ICD-10-CM | POA: Diagnosis not present

## 2016-01-23 DIAGNOSIS — Z1389 Encounter for screening for other disorder: Secondary | ICD-10-CM

## 2016-01-23 DIAGNOSIS — Z331 Pregnant state, incidental: Secondary | ICD-10-CM

## 2016-01-23 LAB — POCT URINE PREGNANCY: PREG TEST UR: NEGATIVE

## 2016-01-23 NOTE — Progress Notes (Signed)
Charleen L Lorette AngWoodson is a 25 y.o. year old [id number] Caucasian female who presents for placement of a Mirena IUD.She is 6wks pp SVB.   No LMP recorded. BP 120/70 (BP Location: Right Arm, Patient Position: Sitting, Cuff Size: Normal)   Pulse 82   Wt 169 lb (76.7 kg)   Breastfeeding? No   BMI 24.25 kg/m  Last sexual intercourse was prior to birth of baby, and pregnancy test today was neg  The risks and benefits of the method and placement have been thouroughly reviewed with the patient and all questions were answered.  Specifically the patient is aware of failure rate of 03/998, expulsion of the IUD and of possible perforation.  The patient is aware of irregular bleeding due to the method and understands the incidence of irregular bleeding diminishes with time.  Signed copy of informed consent in chart.   Time out was performed.  A graves speculum was placed in the vagina.  The cervix was visualized, prepped using Betadine, and grasped with a single tooth tenaculum. The uterus was found to be neutral and it sounded to 8 cm.  Mirena IUD placed per manufacturer's recommendations.   The strings were trimmed to 3 cm.  Sonogram was performed and the proper placement of the IUD was verified via transvaginal u/s.   The patient was given post procedure instructions, including signs and symptoms of infection and to check for the strings after each menses or each month, and refraining from intercourse or anything in the vagina for 3 days.  She was given a Mirena care card with date IUD placed, and date IUD to be removed.  She is scheduled for a f/u appointment in 4 weeks.  Marge DuncansBooker, Octavious Zidek Randall CNM, [facility] 01/23/2016 3:28 PM

## 2016-01-23 NOTE — Patient Instructions (Signed)
 Nothing in vagina for 3 days (no sex, douching, tampons, etc...)  Check your strings once a month to make sure you can feel them, if you are not able to please let us know  If you develop a fever of 100.4 or more in the next few weeks, or if you develop severe abdominal pain, please let us know  Use a backup method of birth control, such as condoms, for 2 weeks    Intrauterine Device Insertion, Care After Refer to this sheet in the next few weeks. These instructions provide you with information on caring for yourself after your procedure. Your health care provider may also give you more specific instructions. Your treatment has been planned according to current medical practices, but problems sometimes occur. Call your health care provider if you have any problems or questions after your procedure. WHAT TO EXPECT AFTER THE PROCEDURE Insertion of the IUD may cause some discomfort, such as cramping. The cramping should improve after the IUD is in place. You may have bleeding after the procedure. This is normal. It varies from light spotting for a few days to menstrual-like bleeding. When the IUD is in place, a string will extend past the cervix into the vagina for 1-2 inches. The strings should not bother you or your partner. If they do, talk to your health care provider.  HOME CARE INSTRUCTIONS   Check your intrauterine device (IUD) to make sure it is in place before you resume sexual activity. You should be able to feel the strings. If you cannot feel the strings, something may be wrong. The IUD may have fallen out of the uterus, or the uterus may have been punctured (perforated) during placement. Also, if the strings are getting longer, it may mean that the IUD is being forced out of the uterus. You no longer have full protection from pregnancy if any of these problems occur.  You may resume sexual intercourse if you are not having problems with the IUD. The copper IUD is considered immediately  effective, and the hormone IUD works right away if inserted within 7 days of your period starting. You will need to use a backup method of birth control for 7 days if the IUD in inserted at any other time in your cycle.  Continue to check that the IUD is still in place by feeling for the strings after every menstrual period.  You may need to take pain medicine such as acetaminophen or ibuprofen. Only take medicines as directed by your health care provider. SEEK MEDICAL CARE IF:   You have bleeding that is heavier or lasts longer than a normal menstrual cycle.  You have a fever.  You have increasing cramps or abdominal pain not relieved with medicine.  You have abdominal pain that does not seem to be related to the same area of earlier cramping and pain.  You are lightheaded, unusually weak, or faint.  You have abnormal vaginal discharge or smells.  You have pain during sexual intercourse.  You cannot feel the IUD strings, or the IUD string has gotten longer.  You feel the IUD at the opening of the cervix in the vagina.  You think you are pregnant, or you miss your menstrual period.  The IUD string is hurting your sex partner. MAKE SURE YOU:  Understand these instructions.  Will watch your condition.  Will get help right away if you are not doing well or get worse.   This information is not intended to replace   advice given to you by your health care provider. Make sure you discuss any questions you have with your health care provider.   Document Released: 11/07/2010 Document Revised: 12/30/2012 Document Reviewed: 08/30/2012 Elsevier Interactive Patient Education 2016 Elsevier Inc.  Levonorgestrel intrauterine device (IUD) What is this medicine? LEVONORGESTREL IUD (LEE voe nor jes trel) is a contraceptive (birth control) device. The device is placed inside the uterus by a healthcare professional. It is used to prevent pregnancy and can also be used to treat heavy bleeding  that occurs during your period. Depending on the device, it can be used for 3 to 5 years. This medicine may be used for other purposes; ask your health care provider or pharmacist if you have questions. What should I tell my health care provider before I take this medicine? They need to know if you have any of these conditions: -abnormal Pap smear -cancer of the breast, uterus, or cervix -diabetes -endometritis -genital or pelvic infection now or in the past -have more than one sexual partner or your partner has more than one partner -heart disease -history of an ectopic or tubal pregnancy -immune system problems -IUD in place -liver disease or tumor -problems with blood clots or take blood-thinners -use intravenous drugs -uterus of unusual shape -vaginal bleeding that has not been explained -an unusual or allergic reaction to levonorgestrel, other hormones, silicone, or polyethylene, medicines, foods, dyes, or preservatives -pregnant or trying to get pregnant -breast-feeding How should I use this medicine? This device is placed inside the uterus by a health care professional. Talk to your pediatrician regarding the use of this medicine in children. Special care may be needed. Overdosage: If you think you have taken too much of this medicine contact a poison control center or emergency room at once. NOTE: This medicine is only for you. Do not share this medicine with others. What if I miss a dose? This does not apply. What may interact with this medicine? Do not take this medicine with any of the following medications: -amprenavir -bosentan -fosamprenavir This medicine may also interact with the following medications: -aprepitant -barbiturate medicines for inducing sleep or treating seizures -bexarotene -griseofulvin -medicines to treat seizures like carbamazepine, ethotoin, felbamate, oxcarbazepine, phenytoin,  topiramate -modafinil -pioglitazone -rifabutin -rifampin -rifapentine -some medicines to treat HIV infection like atazanavir, indinavir, lopinavir, nelfinavir, tipranavir, ritonavir -St. John's wort -warfarin This list may not describe all possible interactions. Give your health care provider a list of all the medicines, herbs, non-prescription drugs, or dietary supplements you use. Also tell them if you smoke, drink alcohol, or use illegal drugs. Some items may interact with your medicine. What should I watch for while using this medicine? Visit your doctor or health care professional for regular check ups. See your doctor if you or your partner has sexual contact with others, becomes HIV positive, or gets a sexual transmitted disease. This product does not protect you against HIV infection (AIDS) or other sexually transmitted diseases. You can check the placement of the IUD yourself by reaching up to the top of your vagina with clean fingers to feel the threads. Do not pull on the threads. It is a good habit to check placement after each menstrual period. Call your doctor right away if you feel more of the IUD than just the threads or if you cannot feel the threads at all. The IUD may come out by itself. You may become pregnant if the device comes out. If you notice that the IUD has come out   use a backup birth control method like condoms and call your health care provider. Using tampons will not change the position of the IUD and are okay to use during your period. What side effects may I notice from receiving this medicine? Side effects that you should report to your doctor or health care professional as soon as possible: -allergic reactions like skin rash, itching or hives, swelling of the face, lips, or tongue -fever, flu-like symptoms -genital sores -high blood pressure -no menstrual period for 6 weeks during use -pain, swelling, warmth in the leg -pelvic pain or tenderness -severe or  sudden headache -signs of pregnancy -stomach cramping -sudden shortness of breath -trouble with balance, talking, or walking -unusual vaginal bleeding, discharge -yellowing of the eyes or skin Side effects that usually do not require medical attention (report to your doctor or health care professional if they continue or are bothersome): -acne -breast pain -change in sex drive or performance -changes in weight -cramping, dizziness, or faintness while the device is being inserted -headache -irregular menstrual bleeding within first 3 to 6 months of use -nausea This list may not describe all possible side effects. Call your doctor for medical advice about side effects. You may report side effects to FDA at 1-800-FDA-1088. Where should I keep my medicine? This does not apply. NOTE: This sheet is a summary. It may not cover all possible information. If you have questions about this medicine, talk to your doctor, pharmacist, or health care provider.    2016, Elsevier/Gold Standard. (2011-04-11 13:54:04)   

## 2016-02-20 ENCOUNTER — Ambulatory Visit: Payer: Medicaid Other | Admitting: Women's Health

## 2016-02-22 ENCOUNTER — Ambulatory Visit: Payer: Medicaid Other | Admitting: Women's Health

## 2016-02-22 ENCOUNTER — Ambulatory Visit (INDEPENDENT_AMBULATORY_CARE_PROVIDER_SITE_OTHER): Payer: Medicaid Other | Admitting: Obstetrics and Gynecology

## 2016-02-22 ENCOUNTER — Encounter: Payer: Self-pay | Admitting: Obstetrics and Gynecology

## 2016-02-22 DIAGNOSIS — Z30431 Encounter for routine checking of intrauterine contraceptive device: Secondary | ICD-10-CM | POA: Insufficient documentation

## 2016-02-22 NOTE — Progress Notes (Signed)
   [facility] Visit  02/22/16           Patient name: Cathren LaineMeagan L Barba MRN [id number]  Date of birth: 02/04/1991  CC & HPI:  Cathren LaineMeagan L Finfrock is a 25 y.o. female presenting today for IUD check. She had a Mirena IUD placed 1 month ago. She denies any issue or complication with this.   ROS:  ROS No complaints at this time   Pertinent History Reviewed:   Reviewed: Significant for  Medical         Past Medical History:  Diagnosis Date  . Anemia   . Anxiety   . BV (bacterial vaginosis)   . Headache   . Hypertension   . Medical history non-contributory   . Nausea and vomiting during pregnancy 05/11/2015  . Pregnancy induced hypertension                               Surgical Hx:    Past Surgical History:  Procedure Laterality Date  . NO PAST SURGERIES     Medications: Reviewed & Updated - see associated section                      No current outpatient prescriptions on file.   Social History: Reviewed -  reports that she has been smoking Cigarettes.  She has a 1.25 pack-year smoking history. She has never used smokeless tobacco.  Objective Findings:  Vitals: Blood pressure 126/74, pulse 98, height 5\' 10"  (1.778 m), weight 169 lb 3.2 oz (76.7 kg), not currently breastfeeding.  Physical Examination: Pelvic - normal external genitalia, vulva, vagina, cervix. IUD strings visualized felt too long, and TRIMMED to 1.5 cm.and less prominent by digital exam  Bedside US shows IUD in proper position    Assessment & Plan:   A:  1. Optimal IUD placement 2. IUD string trimmed  P:  1. Follow up in 1 year or PRN    By signing my name below, I, Sonum Patel, attest that this documentation has been prepared under the direction and in the presence of Tilda BurrowJohn V Harper Smoker, MD. Electronically Signed: Sonum Patel, Neurosurgeoncribe. 02/22/16. 11:42 AM.  I personally performed the services described in this documentation, which was SCRIBED in my presence. The recorded information has been  reviewed and considered accurate. It has been edited as necessary during review. Tilda BurrowFERGUSON,Twain Stenseth V, MD

## 2016-04-11 ENCOUNTER — Ambulatory Visit: Payer: Medicaid Other | Admitting: Women's Health

## 2016-04-15 ENCOUNTER — Ambulatory Visit: Payer: Medicaid Other | Admitting: Women's Health

## 2016-06-19 ENCOUNTER — Encounter: Payer: Self-pay | Admitting: Adult Health

## 2016-06-19 ENCOUNTER — Ambulatory Visit (INDEPENDENT_AMBULATORY_CARE_PROVIDER_SITE_OTHER): Payer: Medicaid Other | Admitting: Adult Health

## 2016-06-19 VITALS — BP 106/60 | HR 91 | Ht 70.0 in | Wt 169.0 lb

## 2016-06-19 DIAGNOSIS — M545 Low back pain: Secondary | ICD-10-CM | POA: Diagnosis not present

## 2016-06-19 DIAGNOSIS — Z30431 Encounter for routine checking of intrauterine contraceptive device: Secondary | ICD-10-CM

## 2016-06-19 DIAGNOSIS — R8299 Other abnormal findings in urine: Secondary | ICD-10-CM

## 2016-06-19 DIAGNOSIS — B079 Viral wart, unspecified: Secondary | ICD-10-CM

## 2016-06-19 DIAGNOSIS — R82998 Other abnormal findings in urine: Secondary | ICD-10-CM

## 2016-06-19 LAB — POCT URINALYSIS DIPSTICK
Glucose, UA: NEGATIVE
Ketones, UA: NEGATIVE
NITRITE UA: NEGATIVE
PROTEIN UA: NEGATIVE
RBC UA: NEGATIVE

## 2016-06-19 MED ORDER — NITROFURANTOIN MONOHYD MACRO 100 MG PO CAPS: 100.0000 mg | ORAL_CAPSULE | Freq: Two times a day (BID) | ORAL | 0 refills | Status: DC

## 2016-06-19 NOTE — Progress Notes (Signed)
Subjective:     Patient ID: Dana Lam, female   DOB: 03/08/1991, 26 y.o.   MRN: [id number]  HPI Dana Lam is a 26 year old white female in complaining of not feeling IUD strings and has low back pain with pressure, is wondering if has UTI.   Review of Systems Low back pain with pressure, ?UTI Can't feel IUD strings Reviewed past medical,surgical, social and family history. Reviewed medications and allergies.     Objective:   Physical Exam BP 106/60 (BP Location: Left Arm, Patient Position: Sitting, Cuff Size: Normal)   Pulse 91   Ht 5\' 10"  (1.778 m)   Wt 169 lb (76.7 kg)   BMI 24.25 kg/m Urine dipstick trace leuks. Skin warm and dry.Pelvic: external genitalia is normal in appearance, she has numerous warts in peri area and 2 1-2 cm skin tags on left groin area, vagina: pink with good moisture and rrugae,urethra has no lesions or masses noted, cervix:smooth, with +IUD strings, but they are short, uterus: normal size, shape and contour, non tender, no masses felt, adnexa: no masses or tenderness noted. Bladder is non tender and no masses felt.  NO CVAT noted. She is not interested in having skin tags or warts removed at this time.    Assessment:     1. IUD check up   2. Low back pain, unspecified back pain laterality, unspecified chronicity, with sciatica presence unspecified   3. Urine WBC increased   4. Viral warts, unspecified type       Plan:     UA C&S sent Rx macrobid 1 bid x 7 days Push fluids Follow up prn

## 2016-06-20 LAB — URINALYSIS, ROUTINE W REFLEX MICROSCOPIC
BILIRUBIN UA: NEGATIVE
Glucose, UA: NEGATIVE
KETONES UA: NEGATIVE
Leukocytes, UA: NEGATIVE
NITRITE UA: NEGATIVE
Protein, UA: NEGATIVE
RBC UA: NEGATIVE
SPEC GRAV UA: 1.027 (ref 1.005–1.030)
UUROB: 0.2 mg/dL (ref 0.2–1.0)
pH, UA: 6 (ref 5.0–7.5)

## 2016-06-21 LAB — URINE CULTURE

## 2016-08-05 ENCOUNTER — Encounter: Payer: Self-pay | Admitting: Adult Health

## 2016-08-05 ENCOUNTER — Other Ambulatory Visit: Payer: Self-pay | Admitting: Adult Health

## 2016-08-05 ENCOUNTER — Ambulatory Visit (INDEPENDENT_AMBULATORY_CARE_PROVIDER_SITE_OTHER): Payer: Medicaid Other | Admitting: Adult Health

## 2016-08-05 VITALS — BP 92/70 | HR 72 | Ht 70.0 in | Wt 170.5 lb

## 2016-08-05 DIAGNOSIS — B079 Viral wart, unspecified: Secondary | ICD-10-CM

## 2016-08-05 DIAGNOSIS — L630 Alopecia (capitis) totalis: Secondary | ICD-10-CM

## 2016-08-05 DIAGNOSIS — L918 Other hypertrophic disorders of the skin: Secondary | ICD-10-CM

## 2016-08-05 NOTE — Addendum Note (Signed)
Addended by: Colen DarlingYOUNG, Cheryl Stabenow S on: 08/05/2016 12:37 PM   Modules accepted: Orders

## 2016-08-05 NOTE — Progress Notes (Signed)
Subjective:     Patient ID: Dana Lam, female   DOB: 01/27/1991, 26 y.o.   MRN: [id number]  HPI Dana HamperMeagan is a 26 year old white female in for skin tag removal in peri area.   Review of Systems For skin tag removal Reviewed past medical,surgical, social and family history. Reviewed medications and allergies.     Objective:   Physical Exam BP 92/70 (BP Location: Left Arm, Patient Position: Sitting, Cuff Size: Normal)   Pulse 72   Ht 5\' 10"  (1.778 m)   Wt 170 lb 8 oz (77.3 kg)   Breastfeeding? No   BMI 24.46 kg/m   Consent signed, time out called and 1% lidocaine 1 cc injected to to base of each skin tag left inner groin area, waited til numb and then cleansed with betadine and hemostat applied at base then excised with scissors, no bleeding and pad applied. She was given mirror to see.She has several small warts that may treat at follow up with aldara or TCA.    Assessment:     1. Skin tags, multiple acquired   2. Viral warts, unspecified type       Plan:     2 skin tags removed and sent to pathology Keep clean and dry for 24 hours Follow up in 1 week for recheck

## 2016-08-12 ENCOUNTER — Ambulatory Visit: Payer: Medicaid Other | Admitting: Adult Health

## 2016-11-08 ENCOUNTER — Ambulatory Visit (INDEPENDENT_AMBULATORY_CARE_PROVIDER_SITE_OTHER): Payer: Medicaid Other | Admitting: Adult Health

## 2016-11-08 ENCOUNTER — Encounter: Payer: Self-pay | Admitting: Adult Health

## 2016-11-08 ENCOUNTER — Ambulatory Visit: Payer: Medicaid Other | Admitting: Obstetrics & Gynecology

## 2016-11-08 VITALS — BP 118/78 | HR 104 | Ht 70.0 in | Wt 171.0 lb

## 2016-11-08 DIAGNOSIS — Z975 Presence of (intrauterine) contraceptive device: Secondary | ICD-10-CM | POA: Diagnosis not present

## 2016-11-08 DIAGNOSIS — N898 Other specified noninflammatory disorders of vagina: Secondary | ICD-10-CM | POA: Diagnosis not present

## 2016-11-08 DIAGNOSIS — N760 Acute vaginitis: Secondary | ICD-10-CM | POA: Diagnosis not present

## 2016-11-08 DIAGNOSIS — B079 Viral wart, unspecified: Secondary | ICD-10-CM

## 2016-11-08 DIAGNOSIS — B9689 Other specified bacterial agents as the cause of diseases classified elsewhere: Secondary | ICD-10-CM

## 2016-11-08 LAB — POCT WET PREP (WET MOUNT)
CLUE CELLS WET PREP WHIFF POC: POSITIVE
WBC, Wet Prep HPF POC: POSITIVE

## 2016-11-08 MED ORDER — METRONIDAZOLE 500 MG PO TABS: 500.0000 mg | ORAL_TABLET | Freq: Two times a day (BID) | ORAL | 0 refills | Status: DC

## 2016-11-08 NOTE — Patient Instructions (Signed)
Bacterial Vaginosis Bacterial vaginosis is a vaginal infection that occurs when the normal balance of bacteria in the vagina is disrupted. It results from an overgrowth of certain bacteria. This is the most common vaginal infection among women ages 15-44. Because bacterial vaginosis increases your risk for STIs (sexually transmitted infections), getting treated can help reduce your risk for chlamydia, gonorrhea, herpes, and HIV (human immunodeficiency virus). Treatment is also important for preventing complications in pregnant women, because this condition can cause an early (premature) delivery. What are the causes? This condition is caused by an increase in harmful bacteria that are normally present in small amounts in the vagina. However, the reason that the condition develops is not fully understood. What increases the risk? The following factors may make you more likely to develop this condition:  Having a new sexual partner or multiple sexual partners.  Having unprotected sex.  Douching.  Having an intrauterine device (IUD).  Smoking.  Drug and alcohol abuse.  Taking certain antibiotic medicines.  Being pregnant.  You cannot get bacterial vaginosis from toilet seats, bedding, swimming pools, or contact with objects around you. What are the signs or symptoms? Symptoms of this condition include:  Grey or white vaginal discharge. The discharge can also be watery or foamy.  A fish-like odor with discharge, especially after sexual intercourse or during menstruation.  Itching in and around the vagina.  Burning or pain with urination.  Some women with bacterial vaginosis have no signs or symptoms. How is this diagnosed? This condition is diagnosed based on:  Your medical history.  A physical exam of the vagina.  Testing a sample of vaginal fluid under a microscope to look for a large amount of bad bacteria or abnormal cells. Your health care provider may use a cotton swab  or a small wooden spatula to collect the sample.  How is this treated? This condition is treated with antibiotics. These may be given as a pill, a vaginal cream, or a medicine that is put into the vagina (suppository). If the condition comes back after treatment, a second round of antibiotics may be needed. Follow these instructions at home: Medicines  Take over-the-counter and prescription medicines only as told by your health care provider.  Take or use your antibiotic as told by your health care provider. Do not stop taking or using the antibiotic even if you start to feel better. General instructions  If you have a female sexual partner, tell her that you have a vaginal infection. She should see her health care provider and be treated if she has symptoms. If you have a female sexual partner, he does not need treatment.  During treatment: ? Avoid sexual activity until you finish treatment. ? Do not douche. ? Avoid alcohol as directed by your health care provider. ? Avoid breastfeeding as directed by your health care provider.  Drink enough water and fluids to keep your urine clear or pale yellow.  Keep the area around your vagina and rectum clean. ? Wash the area daily with warm water. ? Wipe yourself from front to back after using the toilet.  Keep all follow-up visits as told by your health care provider. This is important. How is this prevented?  Do not douche.  Wash the outside of your vagina with warm water only.  Use protection when having sex. This includes latex condoms and dental dams.  Limit how many sexual partners you have. To help prevent bacterial vaginosis, it is best to have sex with just   one partner (monogamous).  Make sure you and your sexual partner are tested for STIs.  Wear cotton or cotton-lined underwear.  Avoid wearing tight pants and pantyhose, especially during summer.  Limit the amount of alcohol that you drink.  Do not use any products that  contain nicotine or tobacco, such as cigarettes and e-cigarettes. If you need help quitting, ask your health care provider.  Do not use illegal drugs. Where to find more information:  Centers for Disease Control and Prevention: www.cdc.gov/std  American Sexual Health Association (ASHA): www.ashastd.org  [place] Department of Health and Human Services, Office on Women's Health: www.womenshealth.gov/ or https://www.womenshealth.gov/a-z-topics/bacterial-vaginosis Contact a health care provider if:  Your symptoms do not improve, even after treatment.  You have more discharge or pain when urinating.  You have a fever.  You have pain in your abdomen.  You have pain during sex.  You have vaginal bleeding between periods. Summary  Bacterial vaginosis is a vaginal infection that occurs when the normal balance of bacteria in the vagina is disrupted.  Because bacterial vaginosis increases your risk for STIs (sexually transmitted infections), getting treated can help reduce your risk for chlamydia, gonorrhea, herpes, and HIV (human immunodeficiency virus). Treatment is also important for preventing complications in pregnant women, because the condition can cause an early (premature) delivery.  This condition is treated with antibiotic medicines. These may be given as a pill, a vaginal cream, or a medicine that is put into the vagina (suppository). This information is not intended to replace advice given to you by your health care provider. Make sure you discuss any questions you have with your health care provider. Document Released: 03/11/2005 Document Revised: 11/25/2015 Document Reviewed: 11/25/2015 Elsevier Interactive Patient Education  2017 Elsevier Inc.  

## 2016-11-08 NOTE — Progress Notes (Signed)
Subjective:     Patient ID: Dana Lam, female   DOB: [birth year], 26 y.o.   MRN: [id number]  HPI Britnei is a 26 year old white female in complaining of vaginal discharge, no odor or itching. Has IUD.  Review of Systems Vaginal discharge, no odor or itching Reviewed past medical,surgical, social and family history. Reviewed medications and allergies.     Objective:   Physical Exam BP 118/78 (BP Location: Left Arm, Patient Position: Sitting, Cuff Size: Normal)   Pulse (!) 104   Ht 5\' 10"  (1.778 m)   Wt 171 lb (77.6 kg)   Breastfeeding? No   BMI 24.54 kg/m   Skin warm and dry.Pelvic: external genitalia is normal in appearance, wart top left labia, vagina: tan discharge with odor,urethra has no lesions or masses noted, cervix:smooth and bulbous,+IUD strings, uterus: normal size, shape and contour, non tender, no masses felt, adnexa: no masses or tenderness noted. Bladder is non tender and no masses felt. Wet prep: + for clue cells and +WBCs. GC/CHL obtained.     Assessment:     1. Vaginal discharge   2. BV (bacterial vaginosis)   3. Viral warts, unspecified type   4. IUD (intrauterine device) in place       Plan:     Rx flagyl 500 mg 1 bid x 7 days, no alcohol, review handout on BV    GC/CHL sent Follow up prn

## 2016-11-12 LAB — GC/CHLAMYDIA PROBE AMP
Chlamydia trachomatis, NAA: NEGATIVE
NEISSERIA GONORRHOEAE BY PCR: NEGATIVE

## 2017-04-01 ENCOUNTER — Ambulatory Visit: Payer: Medicaid Other | Admitting: Obstetrics & Gynecology

## 2017-04-01 ENCOUNTER — Encounter: Payer: Self-pay | Admitting: Obstetrics & Gynecology

## 2017-04-01 VITALS — BP 114/64 | HR 78 | Ht 70.0 in | Wt 178.0 lb

## 2017-04-01 DIAGNOSIS — N760 Acute vaginitis: Secondary | ICD-10-CM | POA: Diagnosis not present

## 2017-04-01 DIAGNOSIS — B9689 Other specified bacterial agents as the cause of diseases classified elsewhere: Secondary | ICD-10-CM

## 2017-04-01 MED ORDER — METRONIDAZOLE 1 % EX GEL: Freq: Every day | CUTANEOUS | 0 refills | Status: DC

## 2017-04-01 NOTE — Progress Notes (Signed)
       Chief Complaint  Patient presents with  . Vaginal Discharge    Blood pressure 114/64, pulse 78, height 5\' 10"  (1.778 m), weight 178 lb (80.7 kg), not currently breastfeeding.  27 y.o. [id number] No LMP recorded. Patient is not currently having periods (Reason: IUD). The current method of family planning is IUD.  Subjective Vaginal discharge for 1weeks Itching no Irritation no Odor yes Similar to previous yes  Previous treatment metronidazole  Objective Vulva:  normal appearing vulva with no masses, tenderness or lesions Vagina:  normal mucosa, thin grey discharge Cervix:  no cervical motion tenderness and no lesions Uterus:  normal size, contour, position, consistency, mobility, non-tender Adnexa: ovaries:,       Pertinent ROS No burning with urination, frequency or urgency No nausea, vomiting or diarrhea Nor fever chills or other constitutional symptoms   Labs or studies Wet Prep:   A sample of vaginal discharge was obtained from the posterior fornix using a cotton swab. 2 drops of saline were placed on a slide and the cotton swab was immersed in the saline. Microscopic evaluation was performed and results were as follows:  Negative  for yeast  Positive for clue cells , consistent with Bacterial vaginosis Negative for trichomonas  Normal WBC population   Whiff test: Positive     Impression Diagnoses this Encounter::   ICD-10-CM   1. BV (bacterial vaginosis) N76.0    B96.89     Established relevant diagnosis(es):   Plan/Recommendations: Meds ordered this encounter  Medications  . metroNIDAZOLE (METROGEL) 1 % gel    Sig: Apply topically daily.    Dispense:  45 g    Refill:  0    Labs or Scans Ordered: No orders of the defined types were placed in this encounter.  Meds ordered this encounter  Medications  . metroNIDAZOLE (METROGEL) 1 % gel    Sig: Apply topically daily.    Dispense:  45 g    Refill:  0    Management:: Meds ordered  this encounter  Medications  . metroNIDAZOLE (METROGEL) 1 % gel    Sig: Apply topically daily.    Dispense:  45 g    Refill:  0     Follow up Return if symptoms worsen or fail to improve.     All questions were answered.

## 2017-08-15 ENCOUNTER — Ambulatory Visit (INDEPENDENT_AMBULATORY_CARE_PROVIDER_SITE_OTHER): Payer: Medicaid Other | Admitting: Women's Health

## 2017-08-15 ENCOUNTER — Encounter: Payer: Self-pay | Admitting: Women's Health

## 2017-08-15 VITALS — BP 124/62 | HR 76 | Ht 70.0 in | Wt 178.4 lb

## 2017-08-15 DIAGNOSIS — Z30432 Encounter for removal of intrauterine contraceptive device: Secondary | ICD-10-CM

## 2017-08-15 DIAGNOSIS — Z30011 Encounter for initial prescription of contraceptive pills: Secondary | ICD-10-CM

## 2017-08-15 MED ORDER — LO LOESTRIN FE 1 MG-10 MCG / 10 MCG PO TABS: 1.0000 | ORAL_TABLET | Freq: Every day | ORAL | 3 refills | Status: DC

## 2017-08-15 NOTE — Patient Instructions (Signed)
Condoms x 2 weeks   Oral Contraception Use Oral contraceptive pills (OCPs) are medicines taken to prevent pregnancy. OCPs work by preventing the ovaries from releasing eggs. The hormones in OCPs also cause the cervical mucus to thicken, preventing the sperm from entering the uterus. The hormones also cause the uterine lining to become thin, not allowing a fertilized egg to attach to the inside of the uterus. OCPs are highly effective when taken exactly as prescribed. However, OCPs do not prevent sexually transmitted diseases (STDs). Safe sex practices, such as using condoms along with an OCP, can help prevent STDs. Before taking OCPs, you may have a physical exam and Pap test. Your health care provider may also order blood tests if necessary. Your health care provider will make sure you are a good candidate for oral contraception. Discuss with your health care provider the possible side effects of the OCP you may be prescribed. When starting an OCP, it can take 2 to 3 months for the body to adjust to the changes in hormone levels in your body. How to take oral contraceptive pills Your health care provider may advise you on how to start taking the first cycle of OCPs. Otherwise, you can:  Start on day 1 of your menstrual period. You will not need any backup contraceptive protection with this start time.  Start on the first Sunday after your menstrual period or the day you get your prescription. In these cases, you will need to use backup contraceptive protection for the first week.  Start the pill at any time of your cycle. If you take the pill within 5 days of the start of your period, you are protected against pregnancy right away. In this case, you will not need a backup form of birth control. If you start at any other time of your menstrual cycle, you will need to use another form of birth control for 7 days. If your OCP is the type called a minipill, it will protect you from pregnancy after taking  it for 2 days (48 hours).  After you have started taking OCPs:  If you forget to take 1 pill, take it as soon as you remember. Take the next pill at the regular time.  If you miss 2 or more pills, call your health care provider because different pills have different instructions for missed doses. Use backup birth control until your next menstrual period starts.  If you use a 28-day pack that contains inactive pills and you miss 1 of the last 7 pills (pills with no hormones), it will not matter. Throw away the rest of the non-hormone pills and start a new pill pack.  No matter which day you start the OCP, you will always start a new pack on that same day of the week. Have an extra pack of OCPs and a backup contraceptive method available in case you miss some pills or lose your OCP pack. Follow these instructions at home:  Do not smoke.  Always use a condom to protect against STDs. OCPs do not protect against STDs.  Use a calendar to mark your menstrual period days.  Read the information and directions that came with your OCP. Talk to your health care provider if you have questions. Contact a health care provider if:  You develop nausea and vomiting.  You have abnormal vaginal discharge or bleeding.  You develop a rash.  You miss your menstrual period.  You are losing your hair.  You need treatment for  mood swings or depression.  You get dizzy when taking the OCP.  You develop acne from taking the OCP.  You become pregnant. Get help right away if:  You develop chest pain.  You develop shortness of breath.  You have an uncontrolled or severe headache.  You develop numbness or slurred speech.  You develop visual problems.  You develop pain, redness, and swelling in the legs. This information is not intended to replace advice given to you by your health care provider. Make sure you discuss any questions you have with your health care provider. Document Released:  02/28/2011 Document Revised: 08/17/2015 Document Reviewed: 08/30/2012 Elsevier Interactive Patient Education  2017 Elsevier Inc.  Ethinyl Estradiol; Norethindrone Acetate; Ferrous fumarate tablets or capsules What is this medicine? ETHINYL ESTRADIOL; NORETHINDRONE ACETATE; FERROUS FUMARATE (ETH in il es tra DYE ole; nor eth IN drone AS e tate; FER [place] FUE ma rate) is an oral contraceptive. The products combine two types of female hormones, an estrogen and a progestin. They are used to prevent ovulation and pregnancy. Some products are also used to treat acne in females. This medicine may be used for other purposes; ask your health care provider or pharmacist if you have questions. COMMON BRAND NAME(S): Blisovi [address] Blisovi Fe, Estrostep Fe, Gildess [address] Gildess Fe 1.5/30, Gildess Fe 1/20, Junel Fe 1.5/30, Junel Fe 1/20, Junel Fe 24, Larin Fe, Lo Loestrin Fe, Loestrin 24 Fe, Loestrin FE 1.5/30, Loestrin FE 1/20, Lomedia 24 Fe, Microgestin 24 Fe, Microgestin Fe 1.5/30, Microgestin Fe 1/20, Tarina Fe 1/20, Taytulla, Tilia Fe, Tri-Legest Fe What should I tell my health care provider before I take this medicine? They need to know if you have any of these conditions: -abnormal vaginal bleeding -blood vessel disease -breast, cervical, endometrial, ovarian, liver, or uterine cancer -diabetes -gallbladder disease -heart disease or recent heart attack -high blood pressure -high cholesterol -history of blood clots -kidney disease -liver disease -migraine headaches -smoke tobacco -stroke -systemic lupus erythematosus (SLE) -an unusual or allergic reaction to estrogens, progestins, other medicines, foods, dyes, or preservatives -pregnant or trying to get pregnant -breast-feeding How should I use this medicine? Take this medicine by mouth. To reduce nausea, this medicine may be taken with food. Follow the directions on the prescription label. Take this medicine at the same time each day and in the  order directed on the package. Do not take your medicine more often than directed. A patient package insert for the product will be given with each prescription and refill. Read this sheet carefully each time. The sheet may change frequently. Contact your pediatrician regarding the use of this medicine in children. Special care may be needed. This medicine has been used in female children who have started having menstrual periods. Overdosage: If you think you have taken too much of this medicine contact a poison control center or emergency room at once. NOTE: This medicine is only for you. Do not share this medicine with others. What if I miss a dose? If you miss a dose, refer to the patient information sheet you received with your medicine for direction. If you miss more than one pill, this medicine may not be as effective and you may need to use another form of birth control. What may interact with this medicine? Do not take this medicine with the following medication: -dasabuvir; ombitasvir; paritaprevir; ritonavir -ombitasvir; paritaprevir; ritonavir This medicine may also interact with the following medications: -acetaminophen -antibiotics or medicines for infections, especially rifampin, rifabutin, rifapentine, and  griseofulvin, and possibly penicillins or tetracyclines -aprepitant -ascorbic acid (vitamin C) -atorvastatin -barbiturate medicines, such as phenobarbital -bosentan -carbamazepine -caffeine -clofibrate -cyclosporine -dantrolene -doxercalciferol -felbamate -grapefruit juice -hydrocortisone -medicines for anxiety or sleeping problems, such as diazepam or temazepam -medicines for diabetes, including pioglitazone -mineral oil -modafinil -mycophenolate -nefazodone -oxcarbazepine -phenytoin -prednisolone -ritonavir or other medicines for HIV infection or AIDS -rosuvastatin -selegiline -soy isoflavones supplements -St. John's wort -tamoxifen or  raloxifene -theophylline -thyroid hormones -topiramate -warfarin This list may not describe all possible interactions. Give your health care provider a list of all the medicines, herbs, non-prescription drugs, or dietary supplements you use. Also tell them if you smoke, drink alcohol, or use illegal drugs. Some items may interact with your medicine. What should I watch for while using this medicine? Visit your doctor or health care professional for regular checks on your progress. You will need a regular breast and pelvic exam and Pap smear while on this medicine. Use an additional method of contraception during the first cycle that you take these tablets. If you have any reason to think you are pregnant, stop taking this medicine right away and contact your doctor or health care professional. If you are taking this medicine for hormone related problems, it may take several cycles of use to see improvement in your condition. Smoking increases the risk of getting a blood clot or having a stroke while you are taking birth control pills, especially if you are more than 26 years old. You are strongly advised not to smoke. This medicine can make your body retain fluid, making your fingers, hands, or ankles swell. Your blood pressure can go up. Contact your doctor or health care professional if you feel you are retaining fluid. This medicine can make you more sensitive to the sun. Keep out of the sun. If you cannot avoid being in the sun, wear protective clothing and use sunscreen. Do not use sun lamps or tanning beds/booths. If you wear contact lenses and notice visual changes, or if the lenses begin to feel uncomfortable, consult your eye care specialist. In some women, tenderness, swelling, or minor bleeding of the gums may occur. Notify your dentist if this happens. Brushing and flossing your teeth regularly may help limit this. See your dentist regularly and inform your dentist of the medicines you are  taking. If you are going to have elective surgery, you may need to stop taking this medicine before the surgery. Consult your health care professional for advice. This medicine does not protect you against HIV infection (AIDS) or any other sexually transmitted diseases. What side effects may I notice from receiving this medicine? Side effects that you should report to your doctor or health care professional as soon as possible: -allergic reactions like skin rash, itching or hives, swelling of the face, lips, or tongue -breast tissue changes or discharge -changes in vaginal bleeding during your period or between your periods -changes in vision -chest pain -confusion -coughing up blood -dizziness -feeling faint or lightheaded -headaches or migraines -leg, arm or groin pain -loss of balance or coordination -severe or sudden headaches -stomach pain (severe) -sudden shortness of breath -sudden numbness or weakness of the face, arm or leg -symptoms of vaginal infection like itching, irritation or unusual discharge -tenderness in the upper abdomen -trouble speaking or understanding -vomiting -yellowing of the eyes or skin Side effects that usually do not require medical attention (report to your doctor or health care professional if they continue or are bothersome): -breakthrough bleeding and spotting that  continues beyond the 3 initial cycles of pills -breast tenderness -mood changes, anxiety, depression, frustration, anger, or emotional outbursts -increased sensitivity to sun or ultraviolet light -nausea -skin rash, acne, or brown spots on the skin -weight gain (slight) This list may not describe all possible side effects. Call your doctor for medical advice about side effects. You may report side effects to FDA at 1-800-FDA-1088. Where should I keep my medicine? Keep out of the reach of children. Store at room temperature between 15 and 30 degrees C (59 and 86 degrees F). Throw away  any unused medicine after the expiration date. NOTE: This sheet is a summary. It may not cover all possible information. If you have questions about this medicine, talk to your doctor, pharmacist, or health care provider.  2018 Elsevier/Gold Standard (2015-11-20 08:04:41)

## 2017-08-15 NOTE — Progress Notes (Signed)
   IUD REMOVAL  Patient name: Dana Lam MRN [id number]  Date of birth: 11/15/1990 Subjective Findings:   Dana Lam is a 27 y.o. G35P1011 Caucasian female being seen today for removal of a Mirena IUD. Her IUD was placed 01/23/16.  She desires removal because she keeps getting recurrent BV. Signed copy of informed consent in chart.   No LMP recorded. (Menstrual status: IUD). Last pap2/16/17. Results were:  normal The planned method of family planning is OCP (estrogen/progesterone). No h/o HTN, DVT/PE, CVA, MI, or migraines w/ aura. Does smoke 1/2ppd Pertinent History Reviewed:   Reviewed past medical,surgical, social, obstetrical and family history.  Reviewed problem list, medications and allergies. Objective Findings & Procedure:    Vitals:   08/15/17 1103  BP: 124/62  Pulse: 76  Weight: 178 lb 6.4 oz (80.9 kg)  Height:  (1.778 m)  Body mass index is 25.6 kg/m.  No results found for this or any previous visit (from the past 24 hour(s)).   Time out was performed.  A graves speculum was placed in the vagina.  The cervix was visualized, and the strings were visible. They were grasped and the Mirena IUD was easily removed intact without complications. The patient tolerated the procedure well.  Assessment & Plan:   1) Mirena IUD removal Follow-up prn problems  2) Contraception management> rx LoLoestrin, advised smoking cessation, discussed potential adverse effects w/ smoking/estrogen. Condoms x 2wks  No orders of the defined types were placed in this encounter.   Follow-up: Return in about 3 months (around 11/15/2017) for F/U.  Cheral Marker CNM, [facility] 08/15/2017 11:30 AM

## 2017-10-04 ENCOUNTER — Encounter (HOSPITAL_COMMUNITY): Payer: Self-pay

## 2017-10-04 ENCOUNTER — Emergency Department (HOSPITAL_COMMUNITY)
Admission: EM | Admit: 2017-10-04 | Discharge: 2017-10-04 | Disposition: A | Discharge status: Home / Self Care | Payer: Medicaid Other | Attending: Emergency Medicine | Admitting: Emergency Medicine

## 2017-10-04 DIAGNOSIS — I10 Essential (primary) hypertension: Secondary | ICD-10-CM | POA: Diagnosis not present

## 2017-10-04 DIAGNOSIS — R1033 Periumbilical pain: Secondary | ICD-10-CM | POA: Diagnosis not present

## 2017-10-04 DIAGNOSIS — B9689 Other specified bacterial agents as the cause of diseases classified elsewhere: Secondary | ICD-10-CM

## 2017-10-04 DIAGNOSIS — L089 Local infection of the skin and subcutaneous tissue, unspecified: Secondary | ICD-10-CM | POA: Diagnosis not present

## 2017-10-04 DIAGNOSIS — F17210 Nicotine dependence, cigarettes, uncomplicated: Secondary | ICD-10-CM | POA: Insufficient documentation

## 2017-10-04 LAB — COMPREHENSIVE METABOLIC PANEL
ALT: 21 U/L (ref 0–44)
AST: 22 U/L (ref 15–41)
Albumin: 4.2 g/dL (ref 3.5–5.0)
Alkaline Phosphatase: 50 U/L (ref 38–126)
Anion gap: 7 (ref 5–15)
BILIRUBIN TOTAL: 0.7 mg/dL (ref 0.3–1.2)
BUN: 8 mg/dL (ref 6–20)
CALCIUM: 9.3 mg/dL (ref 8.9–10.3)
CO2: 27 mmol/L (ref 22–32)
CREATININE: 0.81 mg/dL (ref 0.44–1.00)
Chloride: 107 mmol/L (ref 98–111)
Glucose, Bld: 87 mg/dL (ref 70–99)
Potassium: 3.9 mmol/L (ref 3.5–5.1)
Sodium: 141 mmol/L (ref 135–145)
TOTAL PROTEIN: 7.3 g/dL (ref 6.5–8.1)

## 2017-10-04 LAB — CBC WITH DIFFERENTIAL/PLATELET
BASOS ABS: 0 10*3/uL (ref 0.0–0.1)
BASOS PCT: 0 %
EOS ABS: 0.1 10*3/uL (ref 0.0–0.7)
EOS PCT: 1 %
HCT: 44.5 % (ref 36.0–46.0)
Hemoglobin: 15.1 g/dL — ABNORMAL HIGH (ref 12.0–15.0)
Lymphocytes Relative: 25 %
Lymphs Abs: 1.6 10*3/uL (ref 0.7–4.0)
MCH: 32.5 pg (ref 26.0–34.0)
MCHC: 33.9 g/dL (ref 30.0–36.0)
MCV: 95.7 fL (ref 78.0–100.0)
Monocytes Absolute: 0.4 10*3/uL (ref 0.1–1.0)
Monocytes Relative: 7 %
Neutro Abs: 4.2 10*3/uL (ref 1.7–7.7)
Neutrophils Relative %: 67 %
PLATELETS: 221 10*3/uL (ref 150–400)
RBC: 4.65 MIL/uL (ref 3.87–5.11)
RDW: 12.9 % (ref 11.5–15.5)
WBC: 6.3 10*3/uL (ref 4.0–10.5)

## 2017-10-04 LAB — HCG, QUANTITATIVE, PREGNANCY

## 2017-10-04 MED ORDER — ONDANSETRON HCL 4 MG/2ML IJ SOLN
4.0000 mg | Freq: Once | INTRAMUSCULAR | Status: AC
  Administered 2017-10-04: 4 mg via INTRAVENOUS
  Filled 2017-10-04: qty 2

## 2017-10-04 MED ORDER — IBUPROFEN 600 MG PO TABS: 600.0000 mg | ORAL_TABLET | Freq: Four times a day (QID) | ORAL | 0 refills | Status: DC | PRN

## 2017-10-04 MED ORDER — SULFAMETHOXAZOLE-TRIMETHOPRIM 800-160 MG PO TABS: 1.0000 | ORAL_TABLET | Freq: Two times a day (BID) | ORAL | 0 refills | Status: AC

## 2017-10-04 MED ORDER — SODIUM CHLORIDE 0.9 % IV BOLUS
1000.0000 mL | Freq: Once | INTRAVENOUS | Status: AC
  Administered 2017-10-04: 1000 mL via INTRAVENOUS

## 2017-10-04 NOTE — ED Triage Notes (Signed)
Pt reports naval drainage and swelling for about 2 weeks. Drainage bloody

## 2017-10-04 NOTE — ED Provider Notes (Signed)
[facility] EMERGENCY DEPARTMENT Provider Note   CSN: [id number] Arrival date & time: 10/04/17  0945     History   Chief Complaint Chief Complaint  Patient presents with  . naval drainage    HPI Dana Lam is a 27 y.o. female with a history as outlined below presenting with a 2-week history of periumbilical pain, describing a swelling which has been present for approximately 2 weeks which started draining yesterday morning, stating she woke with a yellow to brown crusted dried drainage in her umbilicus but no improvement in pain.  She woke again this morning this time "abdomen covered in blood".  She also endorses nausea and inability to tolerate any p.o. intake times the past 2 days in addition to subjective fevers. She does have an umbilicus piercing, but has not worn any jewelry here in about 6 years.  She states she spent 2 nights ago dry heaving for most of the night.  She denies dysuria, hematuria, back pain.  No bowel changes.  She has had no treatment prior to arrival.  HPI  Past Medical History:  Diagnosis Date  . Anemia   . Anxiety   . BV (bacterial vaginosis)   . Headache   . Hypertension   . Medical history non-contributory   . Nausea and vomiting during pregnancy 05/11/2015  . Pregnancy induced hypertension     Patient Active Problem List   Diagnosis Date Noted  . Skin tags, multiple acquired 08/05/2016  . Viral warts 08/05/2016  . History of gestational hypertension 01/09/2016  . Breast pain, right 12/19/2015    Past Surgical History:  Procedure Laterality Date  . NO PAST SURGERIES       OB History    Gravida  2   Para  1   Term  1   Preterm      AB  1   Living  1     SAB  1   TAB      Ectopic      Multiple  0   Live Births  1            Home Medications    Prior to Admission medications   Medication Sig Start Date End Date Taking? Authorizing Provider  acetaminophen (TYLENOL) 500 MG tablet Take 500 mg by mouth every 6  (six) hours as needed for mild pain.   Yes [provider]  ibuprofen (ADVIL,MOTRIN) 600 MG tablet Take 1 tablet (600 mg total) by mouth every 6 (six) hours as needed. 10/04/17   Burgess Amor, PA-C  sulfamethoxazole-trimethoprim (BACTRIM DS,SEPTRA DS) 800-160 MG tablet Take 1 tablet by mouth 2 (two) times daily for 10 days. 10/04/17 10/14/17  Burgess Amor, PA-C    Family History Family History  Problem Relation Age of Onset  . Hypertension Mother   . Heart attack Maternal Grandfather   . Cancer Paternal Grandmother        stomach  . Cancer Paternal Grandfather        lung  . Bipolar disorder Brother   . Sleep apnea Brother   . Heart disease Maternal Grandmother   . Other Maternal Grandmother        blood clot  . Arthritis Maternal Grandmother   . Thyroid disease Maternal Grandmother   . Heart attack Maternal Uncle     Social History Social History   Tobacco Use  . Smoking status: Current Every Day Smoker    Packs/day: 0.50    Years: 5.00  Pack years: 2.50    Types: Cigarettes  . Smokeless tobacco: Never Used  . Tobacco comment: smokes 4-5 cig daily  Substance Use Topics  . Alcohol use: No  . Drug use: No     Allergies   Penicillins   Review of Systems Review of Systems  Constitutional: Positive for fever.  HENT: Negative for congestion and sore throat.   Eyes: Negative.   Respiratory: Negative for chest tightness and shortness of breath.   Cardiovascular: Negative for chest pain.  Gastrointestinal: Positive for abdominal pain, nausea and vomiting.  Genitourinary: Negative.   Musculoskeletal: Negative for arthralgias, joint swelling and neck pain.  Skin: Positive for wound. Negative for color change and rash.  Neurological: Negative for dizziness, weakness, light-headedness, numbness and headaches.  Psychiatric/Behavioral: Negative.      Physical Exam Updated Vital Signs BP 114/80 (BP Location: Left Arm)   Pulse 76   Temp 97.9 F (36.6 C) (Oral)    Resp 12   Ht 5\' 10"  (1.778 m)   Wt 86.2 kg (190 lb)   LMP 09/23/2017   SpO2 100%   BMI 27.26 kg/m   Physical Exam  Constitutional: She appears well-developed and well-nourished.  HENT:  Head: Normocephalic and atraumatic.  Eyes: Conjunctivae are normal.  Neck: Normal range of motion.  Cardiovascular: Normal rate, regular rhythm, normal heart sounds and intact distal pulses.  Pulmonary/Chest: Effort normal and breath sounds normal. She has no wheezes.  Abdominal: Soft. Bowel sounds are normal. There is tenderness. There is no guarding.  Patient has generalized tenderness to palpation without guarding or rebound.  There is a small amount of dried blood in her umbilicus with a small central umbilical soft mass.  There is no reproducible drainage with pressure, although this is limited secondary to patient's ability to tolerate.  There is no skin erythema or edema noted.  No induration or palpable masses present.  Musculoskeletal: Normal range of motion.  Neurological: She is alert.  Skin: Skin is warm and dry.  Psychiatric: She has a normal mood and affect.  Nursing note and vitals reviewed.    ED Treatments / Results  Labs (all labs ordered are listed, but only abnormal results are displayed) Labs Reviewed  CBC WITH DIFFERENTIAL/PLATELET - Abnormal; Notable for the following components:      Result Value   Hemoglobin 15.1 (*)    All other components within normal limits  COMPREHENSIVE METABOLIC PANEL  HCG, QUANTITATIVE, PREGNANCY    EKG None  Radiology No results found.  Procedures Procedures (including critical care time)  Medications Ordered in ED Medications  sodium chloride 0.9 % bolus 1,000 mL (1,000 mLs Intravenous New Bag/Given 10/04/17 1057)  ondansetron (ZOFRAN) injection 4 mg (4 mg Intravenous Given 10/04/17 1057)     Initial Impression / Assessment and Plan / ED Course  I have reviewed the triage vital signs and the nursing notes.  Pertinent labs &  imaging results that were available during my care of the patient were reviewed by me and considered in my medical decision making (see chart for details).     Patient's labs reviewed and discussed with patient.  She has exam suggesting a mild umbilical infection.  There is no exam findings to suggest deep abscess, there is no surrounding erythema to suggest cellulitis.  She was placed on Bactrim and also prescribed ibuprofen.  Discussed warm compresses and strict return precautions.  Patient was also given referrals for obtaining a local primary doctor.  Patient was seen by  Dr. Adriana Simas prior to discharge home.  Final Clinical Impressions(s) / ED Diagnoses   Final diagnoses:  Bacterial skin infection    ED Discharge Orders        Ordered    sulfamethoxazole-trimethoprim (BACTRIM DS,SEPTRA DS) 800-160 MG tablet  2 times daily     10/04/17 1201    ibuprofen (ADVIL,MOTRIN) 600 MG tablet  Every 6 hours PRN     10/04/17 1201       Burgess Amor, PA-C 10/04/17 1205    Donnetta Hutching, MD 10/07/17 [id number]

## 2017-10-04 NOTE — Discharge Instructions (Addendum)
Take the entire course of the antibiotic prescribed and use the ibuprofen for pain and relief of inflammation.  Apply a heating pad to the infected site for 20 minutes 3 times daily.

## 2017-10-14 ENCOUNTER — Emergency Department (HOSPITAL_COMMUNITY): Payer: Medicaid Other

## 2017-10-14 ENCOUNTER — Other Ambulatory Visit: Payer: Self-pay

## 2017-10-14 ENCOUNTER — Encounter (HOSPITAL_COMMUNITY): Payer: Self-pay | Admitting: *Deleted

## 2017-10-14 ENCOUNTER — Emergency Department (HOSPITAL_COMMUNITY)
Admission: EM | Admit: 2017-10-14 | Discharge: 2017-10-14 | Disposition: A | Discharge status: Home / Self Care | Payer: Medicaid Other | Attending: Emergency Medicine | Admitting: Emergency Medicine

## 2017-10-14 DIAGNOSIS — R1013 Epigastric pain: Secondary | ICD-10-CM | POA: Insufficient documentation

## 2017-10-14 DIAGNOSIS — I10 Essential (primary) hypertension: Secondary | ICD-10-CM | POA: Diagnosis not present

## 2017-10-14 DIAGNOSIS — R109 Unspecified abdominal pain: Secondary | ICD-10-CM | POA: Diagnosis not present

## 2017-10-14 DIAGNOSIS — E876 Hypokalemia: Secondary | ICD-10-CM | POA: Insufficient documentation

## 2017-10-14 DIAGNOSIS — R112 Nausea with vomiting, unspecified: Secondary | ICD-10-CM | POA: Insufficient documentation

## 2017-10-14 DIAGNOSIS — F17210 Nicotine dependence, cigarettes, uncomplicated: Secondary | ICD-10-CM | POA: Insufficient documentation

## 2017-10-14 DIAGNOSIS — D696 Thrombocytopenia, unspecified: Secondary | ICD-10-CM | POA: Diagnosis not present

## 2017-10-14 LAB — URINALYSIS, ROUTINE W REFLEX MICROSCOPIC
Glucose, UA: NEGATIVE mg/dL
HGB URINE DIPSTICK: NEGATIVE
KETONES UR: 80 mg/dL — AB
NITRITE: NEGATIVE
PH: 5 (ref 5.0–8.0)
Protein, ur: 100 mg/dL — AB
SPECIFIC GRAVITY, URINE: 1.032 — AB (ref 1.005–1.030)

## 2017-10-14 LAB — COMPREHENSIVE METABOLIC PANEL
ALBUMIN: 4.1 g/dL (ref 3.5–5.0)
ALT: 67 U/L — ABNORMAL HIGH (ref 0–44)
ANION GAP: 13 (ref 5–15)
AST: 51 U/L — ABNORMAL HIGH (ref 15–41)
Alkaline Phosphatase: 47 U/L (ref 38–126)
BUN: 13 mg/dL (ref 6–20)
CO2: 21 mmol/L — ABNORMAL LOW (ref 22–32)
Calcium: 8.5 mg/dL — ABNORMAL LOW (ref 8.9–10.3)
Chloride: 99 mmol/L (ref 98–111)
Creatinine, Ser: 0.86 mg/dL (ref 0.44–1.00)
GFR calc non Af Amer: >60 mL/min (ref >60)
GLUCOSE: 86 mg/dL (ref 70–99)
POTASSIUM: 3.1 mmol/L — AB (ref 3.5–5.1)
SODIUM: 133 mmol/L — AB (ref 135–145)
Total Bilirubin: 0.8 mg/dL (ref 0.3–1.2)
Total Protein: 7.1 g/dL (ref 6.5–8.1)

## 2017-10-14 LAB — CBC WITH DIFFERENTIAL/PLATELET
Basophils Absolute: 0 10*3/uL (ref 0.0–0.1)
Basophils Relative: 0 %
EOS ABS: 0.4 10*3/uL (ref 0.0–0.7)
EOS PCT: 10 %
HEMATOCRIT: 42 % (ref 36.0–46.0)
Hemoglobin: 14.8 g/dL (ref 12.0–15.0)
LYMPHS ABS: 0.5 10*3/uL — AB (ref 0.7–4.0)
LYMPHS PCT: 15 %
MCH: 32.3 pg (ref 26.0–34.0)
MCHC: 35.2 g/dL (ref 30.0–36.0)
MCV: 91.7 fL (ref 78.0–100.0)
Monocytes Absolute: 0.4 10*3/uL (ref 0.1–1.0)
Monocytes Relative: 13 %
Neutro Abs: 2.1 10*3/uL (ref 1.7–7.7)
Neutrophils Relative %: 62 %
Platelets: 99 10*3/uL — ABNORMAL LOW (ref 150–400)
RBC: 4.58 MIL/uL (ref 3.87–5.11)
RDW: 12.7 % (ref 11.5–15.5)
WBC: 3.4 10*3/uL — AB (ref 4.0–10.5)

## 2017-10-14 LAB — HCG, QUANTITATIVE, PREGNANCY: hCG, Beta Chain, Quant, S: <1 m[IU]/mL (ref <5)

## 2017-10-14 LAB — LIPASE, BLOOD: LIPASE: 22 U/L (ref 11–51)

## 2017-10-14 MED ORDER — ONDANSETRON 4 MG PO TBDP: 4.0000 mg | ORAL_TABLET | Freq: Three times a day (TID) | ORAL | 0 refills | Status: DC | PRN

## 2017-10-14 MED ORDER — PROMETHAZINE HCL 25 MG/ML IJ SOLN
12.5000 mg | Freq: Once | INTRAMUSCULAR | Status: AC
  Administered 2017-10-14: 12.5 mg via INTRAVENOUS
  Filled 2017-10-14: qty 1

## 2017-10-14 MED ORDER — SODIUM CHLORIDE 0.9 % IV BOLUS
1000.0000 mL | Freq: Once | INTRAVENOUS | Status: AC
  Administered 2017-10-14: 1000 mL via INTRAVENOUS

## 2017-10-14 MED ORDER — ONDANSETRON HCL 4 MG/2ML IJ SOLN
4.0000 mg | Freq: Once | INTRAMUSCULAR | Status: AC
  Administered 2017-10-14: 4 mg via INTRAVENOUS
  Filled 2017-10-14: qty 2

## 2017-10-14 MED ORDER — POTASSIUM CHLORIDE 10 MEQ/100ML IV SOLN
10.0000 meq | Freq: Once | INTRAVENOUS | Status: AC
  Administered 2017-10-14: 10 meq via INTRAVENOUS
  Filled 2017-10-14: qty 100

## 2017-10-14 NOTE — ED Provider Notes (Signed)
[facility] EMERGENCY DEPARTMENT Provider Note   CSN: [id number] Arrival date & time: 10/14/17  0117     History   Chief Complaint Chief Complaint  Patient presents with  . Abdominal Pain    HPI Dana Lam is a 27 y.o. female.  HPI  This is a 27 year old female who presents with nausea, vomiting, and abdominal pain for the last 3 to 4 days.  Patient reports that she has been unable to keep anything down.  Onset of symptoms was Friday.  No known sick contacts.  She is unsure when she last had a bowel movement.  No noted diarrhea.  She denies any fevers.  She describes the pain is crampy and diffuse.  Currently she rates her pain at 8 out of 10.  She has not taken anything for the pain.  Patient currently is on Bactrim for possible infection.  Previously seen on 7/13 for possible infection with drainage from her umbilicus.  She has 2 days of Bactrim left.  Past Medical History:  Diagnosis Date  . Anemia   . Anxiety   . BV (bacterial vaginosis)   . Headache   . Hypertension   . Medical history non-contributory   . Nausea and vomiting during pregnancy 05/11/2015  . Pregnancy induced hypertension     Patient Active Problem List   Diagnosis Date Noted  . Skin tags, multiple acquired 08/05/2016  . Viral warts 08/05/2016  . History of gestational hypertension 01/09/2016  . Breast pain, right 12/19/2015    Past Surgical History:  Procedure Laterality Date  . NO PAST SURGERIES       OB History    Gravida  2   Para  1   Term  1   Preterm      AB  1   Living  1     SAB  1   TAB      Ectopic      Multiple  0   Live Births  1            Home Medications    Prior to Admission medications   Medication Sig Start Date End Date Taking? Authorizing Provider  acetaminophen (TYLENOL) 500 MG tablet Take 500 mg by mouth every 6 (six) hours as needed for mild pain.    [provider]  ibuprofen (ADVIL,MOTRIN) 600 MG tablet Take 1 tablet (600 mg  total) by mouth every 6 (six) hours as needed. 10/04/17   Burgess Amor, PA-C  ondansetron (ZOFRAN ODT) 4 MG disintegrating tablet Take 1 tablet (4 mg total) by mouth every 8 (eight) hours as needed for nausea or vomiting. 10/14/17   Horton, Mayer Masker, MD  sulfamethoxazole-trimethoprim (BACTRIM DS,SEPTRA DS) 800-160 MG tablet Take 1 tablet by mouth 2 (two) times daily for 10 days. 10/04/17 10/14/17  Burgess Amor, PA-C    Family History Family History  Problem Relation Age of Onset  . Hypertension Mother   . Heart attack Maternal Grandfather   . Cancer Paternal Grandmother        stomach  . Cancer Paternal Grandfather        lung  . Bipolar disorder Brother   . Sleep apnea Brother   . Heart disease Maternal Grandmother   . Other Maternal Grandmother        blood clot  . Arthritis Maternal Grandmother   . Thyroid disease Maternal Grandmother   . Heart attack Maternal Uncle     Social History Social History  Tobacco Use  . Smoking status: Current Every Day Smoker    Packs/day: 0.50    Years: 5.00    Pack years: 2.50    Types: Cigarettes  . Smokeless tobacco: Never Used  . Tobacco comment: smokes 4-5 cig daily  Substance Use Topics  . Alcohol use: No  . Drug use: No     Allergies   Penicillins   Review of Systems Review of Systems  Constitutional: Negative for fever.  Respiratory: Negative for shortness of breath.   Cardiovascular: Negative for chest pain.  Gastrointestinal: Positive for abdominal pain, nausea and vomiting. Negative for constipation and diarrhea.  Genitourinary: Negative for dysuria.  Musculoskeletal: Negative for back pain.  Neurological: Negative for headaches.  All other systems reviewed and are negative.    Physical Exam Updated Vital Signs BP 115/74   Pulse 93   Temp 98.5 F (36.9 C) (Oral)   Resp 16   Ht 5\' 10"  (1.778 m)   Wt 86.2 kg (190 lb)   LMP 09/23/2017   SpO2 99%   BMI 27.26 kg/m   Physical Exam  Constitutional: She is  oriented to person, place, and time. She appears well-developed and well-nourished.  HENT:  Head: Normocephalic and atraumatic.  Mouth/Throat: Oropharynx is clear and moist.  Eyes: Pupils are equal, round, and reactive to light.  Neck: Neck supple.  Cardiovascular: Normal rate, regular rhythm and normal heart sounds.  Pulmonary/Chest: Effort normal. No respiratory distress. She has no wheezes.  Abdominal: Soft. Normal appearance and bowel sounds are normal. There is generalized tenderness. There is no rebound and no guarding.  Neurological: She is alert and oriented to person, place, and time.  Skin: Skin is warm and dry.  Psychiatric: She has a normal mood and affect.  Nursing note and vitals reviewed.    ED Treatments / Results  Labs (all labs ordered are listed, but only abnormal results are displayed) Labs Reviewed  URINALYSIS, ROUTINE W REFLEX MICROSCOPIC - Abnormal; Notable for the following components:      Result Value   Color, Urine AMBER (*)    APPearance HAZY (*)    Specific Gravity, Urine 1.032 (*)    Bilirubin Urine SMALL (*)    Ketones, ur 80 (*)    Protein, ur 100 (*)    Leukocytes, UA TRACE (*)    Bacteria, UA FEW (*)    All other components within normal limits  CBC WITH DIFFERENTIAL/PLATELET - Abnormal; Notable for the following components:   WBC 3.4 (*)    Platelets 99 (*)    Lymphs Abs 0.5 (*)    All other components within normal limits  COMPREHENSIVE METABOLIC PANEL - Abnormal; Notable for the following components:   Sodium 133 (*)    Potassium 3.1 (*)    CO2 21 (*)    Calcium 8.5 (*)    AST 51 (*)    ALT 67 (*)    All other components within normal limits  HCG, QUANTITATIVE, PREGNANCY  LIPASE, BLOOD    EKG None  Radiology Dg Abdomen 1 View  Result Date: 10/14/2017 CLINICAL DATA:  Abdominal pain in the umbilical region for a while. EXAM: ABDOMEN - 1 VIEW COMPARISON:  None. FINDINGS: Gas and stool throughout the colon. Gas is seen within a  few small bowel loops. No small or large bowel distention. No radiopaque stones. Visualized bones and soft tissue contours appear intact. Calcified phleboliths in the pelvis. IMPRESSION: Nonobstructive bowel gas pattern with stool-filled colon. Electronically Signed  By: Burman NievesWilliam  Stevens M.D.   On: 10/14/2017 03:39    Procedures Procedures (including critical care time)  Medications Ordered in ED Medications  sodium chloride 0.9 % bolus 1,000 mL (0 mLs Intravenous Stopped 10/14/17 0314)  ondansetron (ZOFRAN) injection 4 mg (4 mg Intravenous Given 10/14/17 0211)  potassium chloride 10 mEq in 100 mL IVPB (10 mEq Intravenous New Bag/Given 10/14/17 0315)  promethazine (PHENERGAN) injection 12.5 mg (12.5 mg Intravenous Given 10/14/17 0354)     Initial Impression / Assessment and Plan / ED Course  I have reviewed the triage vital signs and the nursing notes.  Pertinent labs & imaging results that were available during my care of the patient were reviewed by me and considered in my medical decision making (see chart for details).     Patient presents with abdominal pain, nausea, vomiting.  She is overall nontoxic-appearing on exam and vital signs are reassuring.  She is afebrile.  She has some mild generalized tenderness palpation, no signs of peritonitis.  Patient was given fluids and Zofran.  She is not pregnant.  She has 80 ketones in the urine suggestive of dehydration.  Additionally she has some slight abnormalities in her labs including thrombocytopenia, mild elevation in LFTs, leukopenia.  Suspect that this may all be related to recent Bactrim use.  On recheck, patient states she feels better.  She is able to hydrate without difficulty.  KUB shows no evidence of obstruction.  Given that her abdominal exam is fairly benign, do not feel she needs further imaging at this time.  I have encouraged her to discontinue Bactrim given her lab abnormalities.  She has no evidence at this time of drainage or  redness around the umbilicus.  Will treat symptomatically.  Encourage fluids and increase potassium at home.  Patient was given Zofran for symptom control.  Recommend PCP follow-up for repeat lab work in 1 week.  After history, exam, and medical workup I feel the patient has been appropriately medically screened and is safe for discharge home. Pertinent diagnoses were discussed with the patient. Patient was given return precautions.   Final Clinical Impressions(s) / ED Diagnoses   Final diagnoses:  Epigastric pain  Non-intractable vomiting with nausea, unspecified vomiting type  Hypokalemia  Thrombocytopenia [facility])    ED Discharge Orders        Ordered    ondansetron (ZOFRAN ODT) 4 MG disintegrating tablet  Every 8 hours PRN     10/14/17 0456       Shon BatonHorton, Courtney F, MD 10/14/17 [id number]

## 2017-10-14 NOTE — Discharge Instructions (Addendum)
You were seen today for abdominal pain and vomiting.  Your x-rays and results are largely reassuring.  You do have some lab changes that are consistent with use of Bactrim.  Stop Bactrim as you have no indication for infection at this time.  Make sure that you are drinking lots of fluids and increase her potassium intake.  Follow-up with your primary physician in 1 week for recheck of lab work.  Take Zofran as needed for nausea.

## 2017-10-14 NOTE — ED Notes (Signed)
Pt given water to drink for fluid challenge 

## 2017-10-14 NOTE — ED Triage Notes (Signed)
Pt c/o n/v and abdominal pain x 4 days ago

## 2017-10-28 DIAGNOSIS — Z0000 Encounter for general adult medical examination without abnormal findings: Secondary | ICD-10-CM | POA: Diagnosis not present

## 2017-11-10 ENCOUNTER — Ambulatory Visit (INDEPENDENT_AMBULATORY_CARE_PROVIDER_SITE_OTHER): Payer: Medicaid Other | Admitting: *Deleted

## 2017-11-10 ENCOUNTER — Encounter: Payer: Self-pay | Admitting: Women's Health

## 2017-11-10 ENCOUNTER — Ambulatory Visit (INDEPENDENT_AMBULATORY_CARE_PROVIDER_SITE_OTHER): Payer: Medicaid Other | Admitting: Women's Health

## 2017-11-10 VITALS — BP 110/64 | HR 63 | Ht 70.0 in | Wt 171.0 lb

## 2017-11-10 DIAGNOSIS — Z3202 Encounter for pregnancy test, result negative: Secondary | ICD-10-CM

## 2017-11-10 DIAGNOSIS — Z30013 Encounter for initial prescription of injectable contraceptive: Secondary | ICD-10-CM | POA: Diagnosis not present

## 2017-11-10 DIAGNOSIS — Z3042 Encounter for surveillance of injectable contraceptive: Secondary | ICD-10-CM

## 2017-11-10 LAB — POCT URINE PREGNANCY: Preg Test, Ur: NEGATIVE

## 2017-11-10 MED ORDER — MEDROXYPROGESTERONE ACETATE 150 MG/ML IM SUSP: 150.0000 mg | INTRAMUSCULAR | 3 refills | Status: AC

## 2017-11-10 MED ORDER — MEDROXYPROGESTERONE ACETATE 150 MG/ML IM SUSP
150.0000 mg | Freq: Once | INTRAMUSCULAR | Status: AC
  Administered 2017-11-10: 150 mg via INTRAMUSCULAR

## 2017-11-10 NOTE — Progress Notes (Signed)
   GYN VISIT Patient name: Dana Lam MRN [id number]  Date of birth: 08/18/1990 Chief Complaint:   wants to start Depo  History of Present Illness:   Dana Lam is a 27 y.o. [id number] Caucasian female being seen today to discuss getting depo.  Had IUD removed 08/15/17, started COC's, states they make her feel 'funky', tired. Wants depo, has been on it in past and did well w/ it. Last sex 3-4wks ago.   No LMP recorded. The current method of family planning is OCP (estrogen/progesterone). Last pap 05/11/15. Results were:  normal Review of Systems:   Pertinent items are noted in HPI Denies fever/chills, dizziness, headaches, visual disturbances, fatigue, shortness of breath, chest pain, abdominal pain, vomiting, abnormal vaginal discharge/itching/odor/irritation, problems with periods, bowel movements, urination, or intercourse unless otherwise stated above.  Pertinent History Reviewed:  Reviewed past medical,surgical, social, obstetrical and family history.  Reviewed problem list, medications and allergies. Physical Assessment:   Vitals:   11/10/17 1141  BP: 110/64  Pulse: 63  Weight: 171 lb (77.6 kg)  Height: 5\' 10"  (1.778 m)  Body mass index is 24.54 kg/m.       Physical Examination:   General appearance: alert, well appearing, and in no distress  Mental status: alert, oriented to person, place, and time  Skin: warm & dry   Cardiovascular: normal heart rate noted  Respiratory: normal respiratory effort, no distress  Abdomen: soft, non-tender   Pelvic: examination not indicated  Extremities: no edema   Results for orders placed or performed in visit on 11/10/17 (from the past 24 hour(s))  POCT urine pregnancy   Collection Time: 11/10/17 11:48 AM  Result Value Ref Range   Preg Test, Ur Negative Negative    Assessment & Plan:  1) Contraception management> rx depo w/ 3RF, return today for 1st injection, condoms x 2wks  Meds:  Meds ordered this encounter    Medications  . medroxyPROGESTERone (DEPO-PROVERA) 150 MG/ML injection    Sig: Inject 1 mL (150 mg total) into the muscle every 3 (three) months.    Dispense:  1 mL    Refill:  3    Order Specific Question:   Supervising Provider    Answer:   Duane LopeEURE, LUTHER H [[id number]]    Orders Placed This Encounter  Procedures  . POCT urine pregnancy    Return for Depo injection today.  Cheral MarkerKimberly R Kathreen Dileo CNM, [facility] CenterWHNP-BC 11/10/2017 12:26 PM

## 2017-11-10 NOTE — Progress Notes (Signed)
Pt given DepoProvera 150mg IM right deltoid without complications. Advised to return in 12 weeks for next injection. 

## 2017-11-10 NOTE — Patient Instructions (Signed)

## 2018-01-20 ENCOUNTER — Ambulatory Visit: Payer: Medicaid Other | Admitting: Women's Health

## 2018-02-02 ENCOUNTER — Encounter: Payer: Self-pay | Admitting: *Deleted

## 2018-02-02 ENCOUNTER — Ambulatory Visit (INDEPENDENT_AMBULATORY_CARE_PROVIDER_SITE_OTHER): Payer: Medicaid Other | Admitting: *Deleted

## 2018-02-02 DIAGNOSIS — Z308 Encounter for other contraceptive management: Secondary | ICD-10-CM

## 2018-02-02 DIAGNOSIS — Z3202 Encounter for pregnancy test, result negative: Secondary | ICD-10-CM

## 2018-02-02 DIAGNOSIS — Z3042 Encounter for surveillance of injectable contraceptive: Secondary | ICD-10-CM

## 2018-02-02 LAB — POCT URINE PREGNANCY: PREG TEST UR: NEGATIVE

## 2018-02-02 MED ORDER — MEDROXYPROGESTERONE ACETATE 150 MG/ML IM SUSP
150.0000 mg | Freq: Once | INTRAMUSCULAR | Status: AC
  Administered 2018-02-02: 150 mg via INTRAMUSCULAR

## 2018-02-02 NOTE — Progress Notes (Signed)
Pt here for Depo. Pt received shot in left deltoid. Pt tolerated shot well. Return in 12 weeks for next shot. JSY 

## 2018-03-09 DIAGNOSIS — H5213 Myopia, bilateral: Secondary | ICD-10-CM | POA: Diagnosis not present

## 2018-04-27 ENCOUNTER — Ambulatory Visit: Payer: Medicaid Other

## 2018-05-12 ENCOUNTER — Ambulatory Visit: Payer: Medicaid Other

## 2018-06-23 DIAGNOSIS — M25462 Effusion, left knee: Secondary | ICD-10-CM | POA: Diagnosis not present

## 2018-06-29 ENCOUNTER — Telehealth: Payer: Self-pay | Admitting: Orthopaedic Surgery

## 2018-06-29 NOTE — Telephone Encounter (Signed)
Dana Lam called this morning and canceled her appointment with Dr. Hilda Lias for tomorrow, 06/30/18.  She said she would call back to reschedule appointment if need be

## 2018-06-30 ENCOUNTER — Ambulatory Visit: Payer: Medicaid Other | Admitting: Orthopaedic Surgery

## 2018-12-14 ENCOUNTER — Ambulatory Visit: Payer: Medicaid Other | Admitting: Women's Health

## 2018-12-25 ENCOUNTER — Encounter (HOSPITAL_COMMUNITY): Payer: Self-pay | Admitting: Emergency Medicine

## 2018-12-25 ENCOUNTER — Emergency Department (HOSPITAL_COMMUNITY): Payer: Medicaid Other

## 2018-12-25 ENCOUNTER — Emergency Department (HOSPITAL_COMMUNITY)
Admission: EM | Admit: 2018-12-25 | Discharge: 2018-12-25 | Disposition: A | Discharge status: Home / Self Care | Payer: Medicaid Other | Attending: Emergency Medicine | Admitting: Emergency Medicine

## 2018-12-25 ENCOUNTER — Other Ambulatory Visit: Payer: Self-pay

## 2018-12-25 DIAGNOSIS — Y999 Unspecified external cause status: Secondary | ICD-10-CM | POA: Insufficient documentation

## 2018-12-25 DIAGNOSIS — I10 Essential (primary) hypertension: Secondary | ICD-10-CM | POA: Insufficient documentation

## 2018-12-25 DIAGNOSIS — F17210 Nicotine dependence, cigarettes, uncomplicated: Secondary | ICD-10-CM | POA: Diagnosis not present

## 2018-12-25 DIAGNOSIS — R600 Localized edema: Secondary | ICD-10-CM | POA: Diagnosis not present

## 2018-12-25 DIAGNOSIS — S99911A Unspecified injury of right ankle, initial encounter: Secondary | ICD-10-CM | POA: Diagnosis not present

## 2018-12-25 DIAGNOSIS — S99921A Unspecified injury of right foot, initial encounter: Secondary | ICD-10-CM | POA: Diagnosis not present

## 2018-12-25 DIAGNOSIS — Y929 Unspecified place or not applicable: Secondary | ICD-10-CM | POA: Diagnosis not present

## 2018-12-25 DIAGNOSIS — X501XXA Overexertion from prolonged static or awkward postures, initial encounter: Secondary | ICD-10-CM | POA: Diagnosis not present

## 2018-12-25 DIAGNOSIS — Y9389 Activity, other specified: Secondary | ICD-10-CM | POA: Insufficient documentation

## 2018-12-25 DIAGNOSIS — S93401A Sprain of unspecified ligament of right ankle, initial encounter: Secondary | ICD-10-CM | POA: Insufficient documentation

## 2018-12-25 MED ORDER — HYDROCODONE-ACETAMINOPHEN 5-325 MG PO TABS
1.0000 | ORAL_TABLET | Freq: Once | ORAL | Status: AC
  Administered 2018-12-25: 1 via ORAL
  Filled 2018-12-25: qty 1

## 2018-12-25 NOTE — Discharge Instructions (Signed)
You were evaluated in the Emergency Department and after careful evaluation, we did not find any emergent condition requiring admission or further testing in the hospital.  Your exam/testing today was overall reassuring.  Your x-rays did not show any broken bones.  Your pain is likely due to a sprain.  Please rest and use crutches for the next week to allow the ankle to heal.  If still with significant pain after 2 weeks, we recommend follow-up with an orthopedic specialist.  Please return to the Emergency Department if you experience any worsening of your condition.  We encourage you to follow up with a primary care provider.  Thank you for allowing [place] to be a part of your care.

## 2018-12-25 NOTE — ED Notes (Signed)
Pt returned from X Ray.

## 2018-12-25 NOTE — ED Provider Notes (Signed)
AP-EMERGENCY DEPT [facility] Emergency Department Provider Note MRN:  [id number]  Arrival date & time: 12/25/18     Chief Complaint   Ankle Pain   History of Present Illness   Dana Lam is a 28 y.o. year-old female with a history of hypertension presenting to the ED with chief complaint of ankle pain.  Location: Right ankle Duration: 3 days Onset: Sudden, missed a step while going down some stairs, forced inversion Timing: Constant pain Description: Sharp Severity: Moderate Exacerbating/Alleviating Factors: Worse with ambulating Associated Symptoms: Bruising Pertinent Negatives: Denies head trauma, no loss of consciousness, no other injuries.   Review of Systems  A complete 10 system review of systems was obtained and all systems are negative except as noted in the HPI and PMH.   Patient's Health History    Past Medical History:  Diagnosis Date  . Anemia   . Anxiety   . BV (bacterial vaginosis)   . Headache   . Hypertension   . Medical history non-contributory   . Nausea and vomiting during pregnancy 05/11/2015  . Pregnancy induced hypertension     Past Surgical History:  Procedure Laterality Date  . NO PAST SURGERIES      Family History  Problem Relation Age of Onset  . Hypertension Mother   . Heart attack Maternal Grandfather   . Cancer Paternal Grandmother        stomach  . Cancer Paternal Grandfather        lung  . Bipolar disorder Brother   . Sleep apnea Brother   . Heart disease Maternal Grandmother   . Other Maternal Grandmother        blood clot  . Arthritis Maternal Grandmother   . Thyroid disease Maternal Grandmother   . Heart attack Maternal Uncle     Social History   Socioeconomic History  . Marital status: Single    Spouse name: Not on file  . Number of children: Not on file  . Years of education: Not on file  . Highest education level: Not on file  Occupational History  . Not on file  Social Needs  . Financial  resource strain: Not on file  . Food insecurity    Worry: Not on file    Inability: Not on file  . Transportation needs    Medical: Not on file    Non-medical: Not on file  Tobacco Use  . Smoking status: Current Every Day Smoker    Packs/day: 0.50    Years: 14.00    Pack years: 7.00    Types: Cigarettes  . Smokeless tobacco: Never Used  Substance and Sexual Activity  . Alcohol use: No  . Drug use: No  . Sexual activity: Not Currently    Birth control/protection: Injection  Lifestyle  . Physical activity    Days per week: Not on file    Minutes per session: Not on file  . Stress: Not on file  Relationships  . Social Musician on phone: Not on file    Gets together: Not on file    Attends religious service: Not on file    Active member of club or organization: Not on file    Attends meetings of clubs or organizations: Not on file    Relationship status: Not on file  . Intimate partner violence    Fear of current or ex partner: Not on file    Emotionally abused: Not on file    Physically abused: Not  on file    Forced sexual activity: Not on file  Other Topics Concern  . Not on file  Social History Narrative  . Not on file     Physical Exam  Vital Signs and Nursing Notes reviewed Vitals:   12/25/18 2135  BP: (!) 145/79  Pulse: (!) 106  Resp: 18  Temp: 98 F (36.7 C)  SpO2: 100%    CONSTITUTIONAL: Well-appearing, NAD NEURO:  Alert and oriented x 3, no focal deficits EYES:  eyes equal and reactive ENT/NECK:  no LAD, no JVD CARDIO: Regular rate, well-perfused, normal S1 and S2 PULM:  CTAB no wheezing or rhonchi GI/GU:  normal bowel sounds, non-distended, non-tender MSK/SPINE: Edema and bruising surrounding the right ankle, neurovascularly intact distally, soft compartments of the right lower extremity SKIN:  no rash PSYCH:  Appropriate speech and behavior  Diagnostic and Interventional Summary    Labs Reviewed - No data to display  DG Foot  Complete Right  Final Result    DG Ankle Complete Right  Final Result      Medications  HYDROcodone-acetaminophen (NORCO/VICODIN) 5-325 MG per tablet 1 tablet (1 tablet Oral Given 12/25/18 2249)     Procedures Critical Care  ED Course and Medical Decision Making  I have reviewed the triage vital signs and the nursing notes.  Pertinent labs & imaging results that were available during my care of the patient were reviewed by me and considered in my medical decision making (see below for details).  X-rays negative for fracture.  Consistent with sprain.  We will follow-up with PCP or orthopedist.  Barth Kirks. Sedonia Small, [place] [email or web address]  Final Clinical Impressions(s) / ED Diagnoses     ICD-10-CM   1. Sprain of right ankle, unspecified ligament, initial encounter  S93.401A     ED Discharge Orders    None      Discharge Instructions Discussed with and Provided to Patient:   Discharge Instructions     You were evaluated in the Emergency Department and after careful evaluation, we did not find any emergent condition requiring admission or further testing in the hospital.  Your exam/testing today was overall reassuring.  Your x-rays did not show any broken bones.  Your pain is likely due to a sprain.  Please rest and use crutches for the next week to allow the ankle to heal.  If still with significant pain after 2 weeks, we recommend follow-up with an orthopedic specialist.  Please return to the Emergency Department if you experience any worsening of your condition.  We encourage you to follow up with a primary care provider.  Thank you for allowing [place] to be a part of your care.       Maudie Flakes, MD 12/25/18 2302

## 2018-12-25 NOTE — ED Triage Notes (Signed)
Pt fell on Tues and "rolled" her R ankle. Pt here with R ankle pain, bruising, and swelling.

## 2019-02-23 ENCOUNTER — Other Ambulatory Visit: Payer: Self-pay

## 2019-02-23 ENCOUNTER — Other Ambulatory Visit (HOSPITAL_COMMUNITY)
Admission: RE | Admit: 2019-02-23 | Discharge: 2019-02-23 | Disposition: A | Discharge status: Home / Self Care | Payer: Medicaid Other | Source: Ambulatory Visit | Attending: Obstetrics & Gynecology | Admitting: Obstetrics & Gynecology

## 2019-02-23 ENCOUNTER — Other Ambulatory Visit (INDEPENDENT_AMBULATORY_CARE_PROVIDER_SITE_OTHER): Payer: Medicaid Other | Admitting: *Deleted

## 2019-02-23 DIAGNOSIS — Z202 Contact with and (suspected) exposure to infections with a predominantly sexual mode of transmission: Secondary | ICD-10-CM | POA: Insufficient documentation

## 2019-02-23 NOTE — Progress Notes (Addendum)
   NURSE VISIT- VAGINITIS/STD/POC  SUBJECTIVE:  Dana Lam is a 28 y.o. [id number] GYN patientfemale here for a vaginal swab for STD screen.  She reports the following symptoms: none . Denies abnormal vaginal bleeding, significant pelvic pain, fever, or UTI symptoms.  OBJECTIVE:  There were no vitals taken for this visit.  Appears well, in no apparent distress  ASSESSMENT: Vaginal swab for STD screen  PLAN: Self-collected vaginal probe for Gonorrhea, Chlamydia, Trichomonas sent to lab Treatment: to be determined once results are received Follow-up as needed if symptoms persist/worsen, or new symptoms develop  Kessa Fairbairn, Celene Squibb  02/23/2019 11:06 AM   Chart reviewed for nurse visit. Agree with plan of care.  Roma Schanz, [place] 02/23/2019 11:24 AM

## 2019-02-24 LAB — CERVICOVAGINAL ANCILLARY ONLY
Chlamydia: NEGATIVE
Comment: NEGATIVE
Comment: NEGATIVE
Comment: NORMAL
Neisseria Gonorrhea: NEGATIVE
Trichomonas: NEGATIVE

## 2019-03-30 ENCOUNTER — Ambulatory Visit: Payer: Medicaid Other | Attending: Internal Medicine

## 2019-03-30 ENCOUNTER — Other Ambulatory Visit: Payer: Self-pay

## 2019-03-30 DIAGNOSIS — Z20822 Contact with and (suspected) exposure to covid-19: Secondary | ICD-10-CM

## 2019-04-01 LAB — NOVEL CORONAVIRUS, NAA: SARS-CoV-2, NAA: NOT DETECTED

## 2020-02-08 IMAGING — DX DG FOOT COMPLETE 3+V*R*
3 series · 3 of 3 positions shown · non-contrast
Comparison: None.

CLINICAL DATA: Twisted ankle fall

EXAM:
RIGHT FOOT COMPLETE - 3+ VIEW

[foot ap]
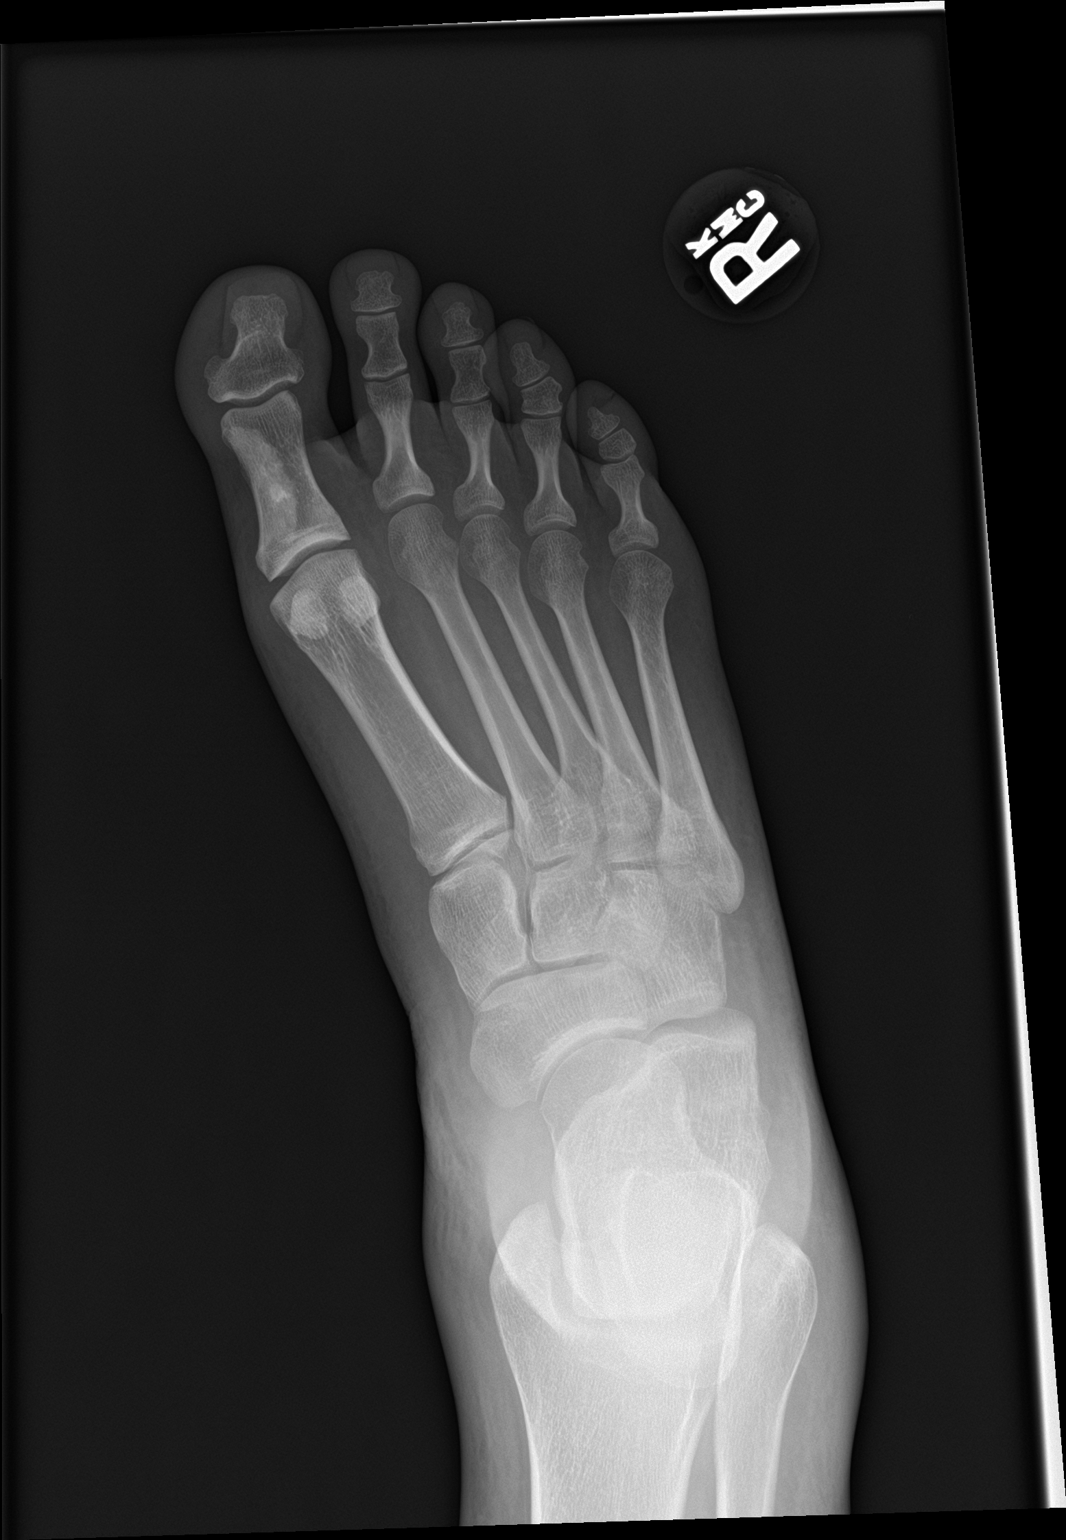

[foot obl]
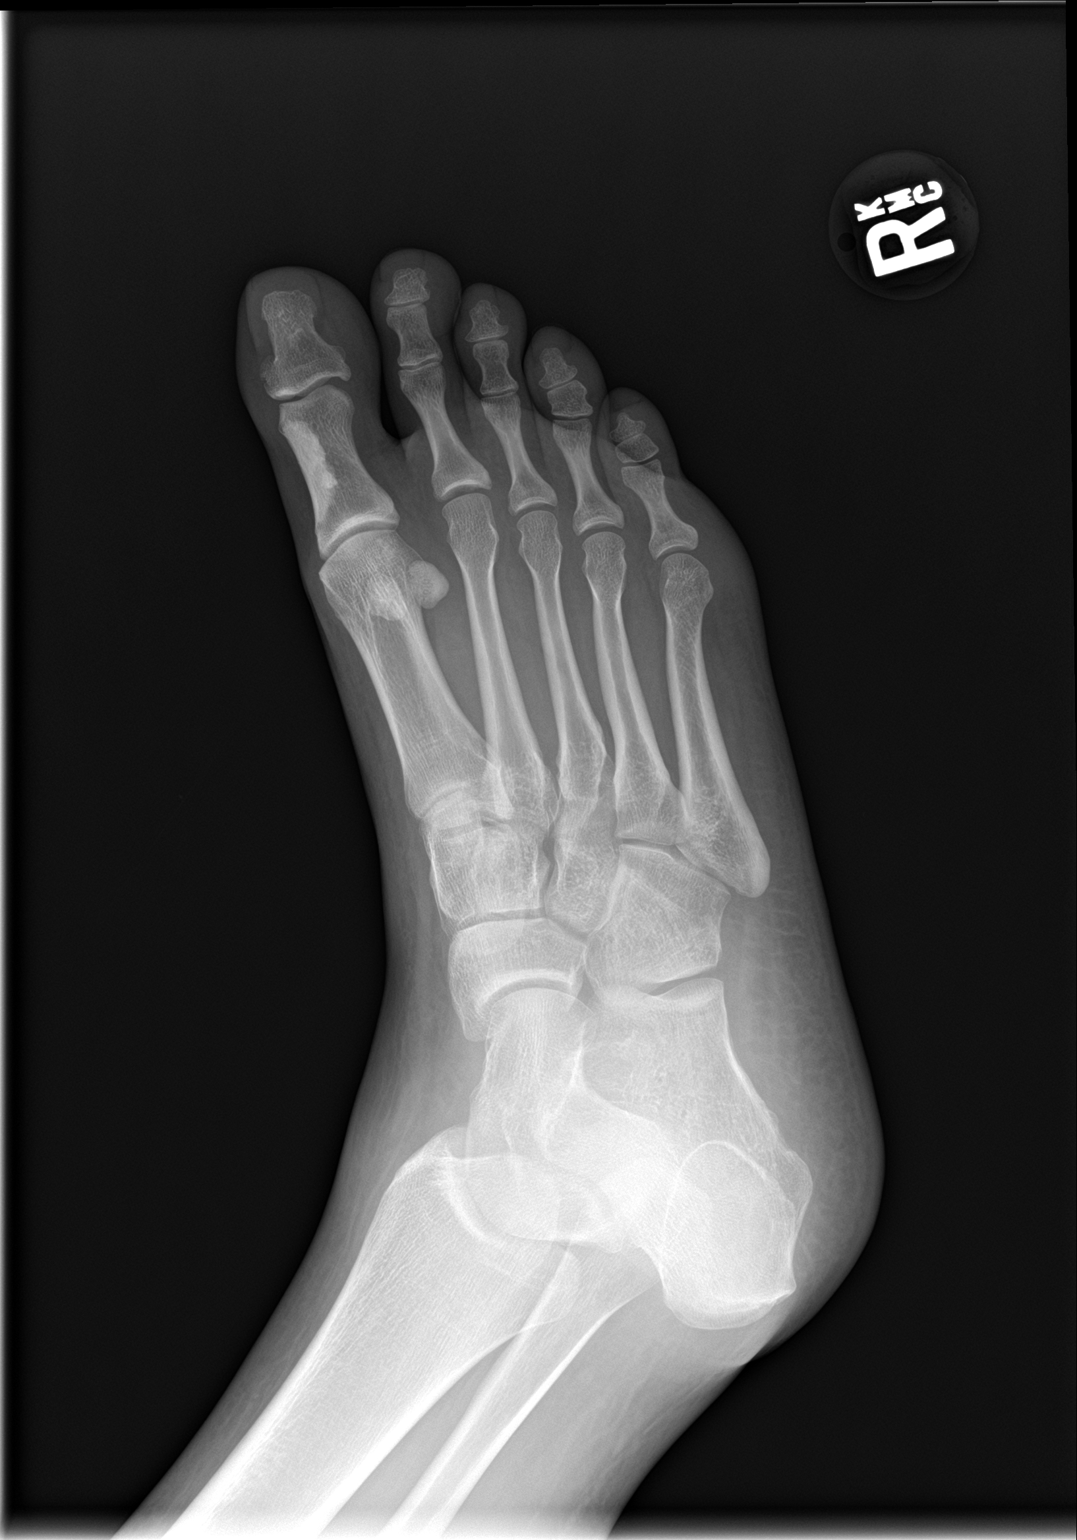

[foot lat]
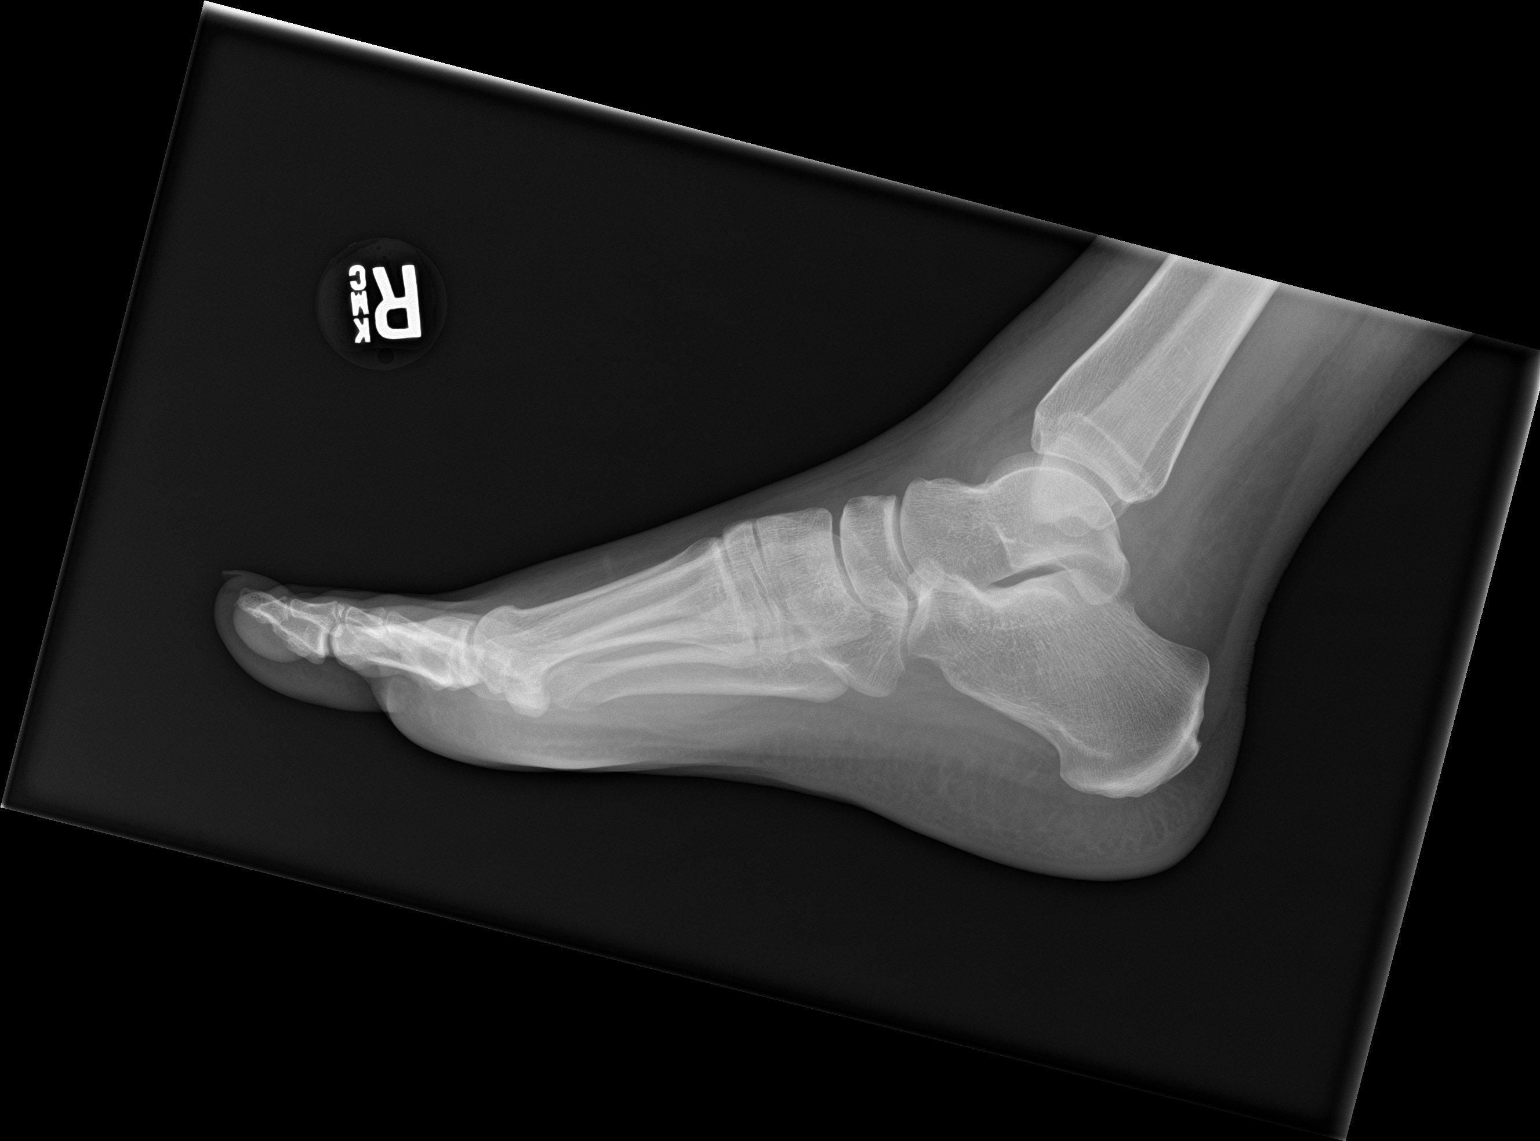

[3 of 3 positions shown; findings below may reference images not displayed]

FINDINGS: There is no evidence of fracture or dislocation. There is no
evidence of arthropathy or other focal bone abnormality. Soft
tissues are unremarkable.
IMPRESSION: No acute osseus injury.

## 2020-03-23 DIAGNOSIS — H5213 Myopia, bilateral: Secondary | ICD-10-CM | POA: Diagnosis not present

## 2020-09-14 DIAGNOSIS — R6883 Chills (without fever): Secondary | ICD-10-CM | POA: Diagnosis not present

## 2020-09-14 DIAGNOSIS — G43909 Migraine, unspecified, not intractable, without status migrainosus: Secondary | ICD-10-CM | POA: Diagnosis not present

## 2020-09-14 DIAGNOSIS — J029 Acute pharyngitis, unspecified: Secondary | ICD-10-CM | POA: Diagnosis not present

## 2020-09-14 DIAGNOSIS — R5383 Other fatigue: Secondary | ICD-10-CM | POA: Diagnosis not present

## 2020-10-02 DIAGNOSIS — F319 Bipolar disorder, unspecified: Secondary | ICD-10-CM | POA: Diagnosis not present

## 2020-10-02 DIAGNOSIS — F411 Generalized anxiety disorder: Secondary | ICD-10-CM | POA: Diagnosis not present

## 2020-10-27 DIAGNOSIS — G4700 Insomnia, unspecified: Secondary | ICD-10-CM | POA: Diagnosis not present

## 2020-10-27 DIAGNOSIS — F329 Major depressive disorder, single episode, unspecified: Secondary | ICD-10-CM | POA: Diagnosis not present

## 2020-10-27 DIAGNOSIS — F419 Anxiety disorder, unspecified: Secondary | ICD-10-CM | POA: Diagnosis not present

## 2022-12-06 DIAGNOSIS — J019 Acute sinusitis, unspecified: Secondary | ICD-10-CM | POA: Diagnosis not present

## 2022-12-06 DIAGNOSIS — J209 Acute bronchitis, unspecified: Secondary | ICD-10-CM | POA: Diagnosis not present

## 2022-12-06 DIAGNOSIS — J069 Acute upper respiratory infection, unspecified: Secondary | ICD-10-CM | POA: Diagnosis not present

## 2023-11-17 ENCOUNTER — Emergency Department (HOSPITAL_COMMUNITY)
Admission: EM | Admit: 2023-11-17 | Discharge: 2023-11-17 | Disposition: A | Discharge status: Home / Self Care | Attending: Emergency Medicine | Admitting: Emergency Medicine

## 2023-11-17 ENCOUNTER — Other Ambulatory Visit: Payer: Self-pay

## 2023-11-17 ENCOUNTER — Encounter (HOSPITAL_COMMUNITY): Payer: Self-pay

## 2023-11-17 DIAGNOSIS — K0889 Other specified disorders of teeth and supporting structures: Secondary | ICD-10-CM | POA: Diagnosis not present

## 2023-11-17 DIAGNOSIS — Z79899 Other long term (current) drug therapy: Secondary | ICD-10-CM | POA: Insufficient documentation

## 2023-11-17 DIAGNOSIS — K029 Dental caries, unspecified: Secondary | ICD-10-CM | POA: Diagnosis not present

## 2023-11-17 MED ORDER — CLINDAMYCIN HCL 150 MG PO CAPS
300.0000 mg | ORAL_CAPSULE | Freq: Once | ORAL | Status: AC
  Administered 2023-11-17: 300 mg via ORAL
  Filled 2023-11-17: qty 2

## 2023-11-17 MED ORDER — DICLOFENAC SODIUM 75 MG PO TBEC: 75.0000 mg | DELAYED_RELEASE_TABLET | Freq: Two times a day (BID) | ORAL | 0 refills | Status: AC

## 2023-11-17 MED ORDER — HYDROCODONE-ACETAMINOPHEN 5-325 MG PO TABS
1.0000 | ORAL_TABLET | Freq: Once | ORAL | Status: AC
  Administered 2023-11-17: 1 via ORAL
  Filled 2023-11-17: qty 1

## 2023-11-17 MED ORDER — CLINDAMYCIN HCL 300 MG PO CAPS: 300.0000 mg | ORAL_CAPSULE | Freq: Three times a day (TID) | ORAL | 0 refills | Status: AC

## 2023-11-17 NOTE — ED Provider Notes (Signed)
 [place] EMERGENCY DEPARTMENT AT [facility] Provider Note   CSN: [id number] Arrival date & time: 11/17/23  2012     Patient presents with: No chief complaint on file.   Dana Lam is a 33 y.o. female.   Patient complains of a toothache.  Patient reports that she has had increased pain tonight.  Patient does not have a dentist.  She reports no relief with Tylenol .  Patient states she has been told that she needs to have the tooth extracted but is unable to pay for the procedure.  Patient denies any fever or chills.  She denies any swelling.  Patient is allergic to penicillin.  The history is provided by the patient. No language interpreter was used.       Prior to Admission medications   Medication Sig Start Date End Date Taking? Authorizing Provider  acetaminophen  (TYLENOL ) 500 MG tablet Take 500 mg by mouth every 6 (six) hours as needed for mild pain.    [provider]  medroxyPROGESTERone  (DEPO-PROVERA ) 150 MG/ML injection Inject 1 mL (150 mg total) into the muscle every 3 (three) months. 11/10/17   Kizzie Suzen SAUNDERS, CNM    Allergies: Other and Penicillins    Review of Systems  HENT:  Positive for dental problem.   All other systems reviewed and are negative.   Updated Vital Signs BP 135/82 (BP Location: Right Arm)   Pulse 98   Temp 99.4 F (37.4 C) (Oral)   Resp 18   SpO2 100%   Physical Exam Vitals and nursing note reviewed.  Constitutional:      Appearance: She is well-developed.  HENT:     Head: Normocephalic.     Mouth/Throat:     Mouth: Mucous membranes are moist.     Comments: Multiple dental caries, no gum swelling, no sign of abscess Cardiovascular:     Rate and Rhythm: Normal rate.  Pulmonary:     Effort: Pulmonary effort is normal.  Abdominal:     General: There is no distension.  Skin:    General: Skin is warm.  Neurological:     General: No focal deficit present.     Mental Status: She is alert and oriented to  person, place, and time.     (all labs ordered are listed, but only abnormal results are displayed) Labs Reviewed - No data to display  EKG: None  Radiology: No results found.   Procedures   Medications Ordered in the ED - No data to display                                  Medical Decision Making Patient complains of a toothache.  Patient does not have a dentist.  Patient denies any fever or chills  Risk Prescription drug management. Risk Details: Patient is given a dose of clindamycin  and hydrocodone  tablet here.  Patient is given a prescription for Voltaren  and clindamycin .  Patient is get up and dental resource guide.        Final diagnoses:  Toothache    ED Discharge Orders          Ordered    clindamycin  (CLEOCIN ) 300 MG capsule  3 times daily        11/17/23 2155    diclofenac  (VOLTAREN ) 75 MG EC tablet  2 times daily        11/17/23 2155  An After Visit Summary was printed and given to the patient.    Koleton Duchemin K, PA-C 11/17/23 2156    Towana Ozell BROCKS, MD 11/18/23 [phone]

## 2023-11-17 NOTE — ED Triage Notes (Signed)
 Pt c/o toothache to very back bottom left tooth x few weeks but pain is worse today. States she does not have the money to see a Education officer, community.

## 2024-04-05 ENCOUNTER — Encounter (HOSPITAL_COMMUNITY): Payer: Self-pay

## 2024-04-05 ENCOUNTER — Emergency Department (HOSPITAL_COMMUNITY)
Admission: EM | Admit: 2024-04-05 | Discharge: 2024-04-05 | Disposition: A | Discharge status: Home / Self Care | Payer: MEDICAID | Attending: Emergency Medicine | Admitting: Emergency Medicine

## 2024-04-05 DIAGNOSIS — R21 Rash and other nonspecific skin eruption: Secondary | ICD-10-CM | POA: Diagnosis present

## 2024-04-05 DIAGNOSIS — L210 Seborrhea capitis: Secondary | ICD-10-CM | POA: Insufficient documentation

## 2024-04-05 MED ORDER — PREDNISONE 20 MG PO TABS: 40.0000 mg | ORAL_TABLET | Freq: Every day | ORAL | 0 refills | Status: AC

## 2024-04-05 NOTE — Discharge Instructions (Addendum)
 Take the prednisone  as prescribed, avoid new topical medications or other substances such as new [place] detergents soaps etc.  Please have your family doctor recheck you in about a week to make sure things are getting better.  If you develop blisters or worsening rash or symptoms return to the ER immediately

## 2024-04-05 NOTE — ED Triage Notes (Addendum)
 Pt c/o generalized rash x1 week.  Pt reports it itches and burns.  Sts she has been using OTC creams w/o relief.  Pt denies any creams, lotions, soaps, detergents, etc.

## 2024-04-05 NOTE — ED Provider Notes (Signed)
 " [place] EMERGENCY DEPARTMENT AT [facility] Provider Note   CSN: [id number] Arrival date & time: 04/05/24  1601     Patient presents with: Rash   Dana Lam is a 34 y.o. female.    Rash This patient is a 34 year old female with no chronic medical conditions on no daily medicines who presents with about 1 week of a rash, this is located on her trunk around her abdomen and chest as well as on her arms and a couple spots on her thighs.  It is itchy, round, red, blanching it is not scaling dry or wet.  The patient denies any lesions in the mouth on the eyes on the palms on the soles and has not been on any new medications.  She does have a cat has never had ringworm, nobody else around her has a similar rash.  She has used some topical medications without significant relief     Prior to Admission medications  Medication Sig Start Date End Date Taking? Authorizing Provider  predniSONE  (DELTASONE ) 20 MG tablet Take 2 tablets (40 mg total) by mouth daily. 04/05/24  Yes Cleotilde Rogue, MD  acetaminophen  (TYLENOL ) 500 MG tablet Take 500 mg by mouth every 6 (six) hours as needed for mild pain.    [provider]  diclofenac  (VOLTAREN ) 75 MG EC tablet Take 1 tablet (75 mg total) by mouth 2 (two) times daily. 11/17/23   Sofia, Leslie K, PA-C  medroxyPROGESTERone  (DEPO-PROVERA ) 150 MG/ML injection Inject 1 mL (150 mg total) into the muscle every 3 (three) months. 11/10/17   Kizzie Suzen SAUNDERS, CNM    Allergies: Other and Penicillins    Review of Systems  Skin:  Positive for rash.  All other systems reviewed and are negative.   Updated Vital Signs BP 115/84 (BP Location: Right Arm)   Pulse 70   Temp 98 F (36.7 C) (Oral)   Resp 17   Ht 1.778 m (5' 10)   Wt 85.3 kg   SpO2 100%   BMI 26.98 kg/m   Physical Exam Vitals and nursing note reviewed.  Constitutional:      General: She is not in acute distress.    Appearance: She is well-developed.  HENT:      Head: Normocephalic and atraumatic.     Mouth/Throat:     Pharynx: No oropharyngeal exudate.  Eyes:     General: No scleral icterus.       Right eye: No discharge.        Left eye: No discharge.     Conjunctiva/sclera: Conjunctivae normal.     Pupils: Pupils are equal, round, and reactive to light.  Neck:     Thyroid: No thyromegaly.     Vascular: No JVD.  Cardiovascular:     Rate and Rhythm: Normal rate and regular rhythm.     Heart sounds: Normal heart sounds. No murmur heard.    No friction rub. No gallop.  Pulmonary:     Effort: Pulmonary effort is normal. No respiratory distress.     Breath sounds: Normal breath sounds. No wheezing or rales.  Abdominal:     General: Bowel sounds are normal. There is no distension.     Palpations: Abdomen is soft. There is no mass.     Tenderness: There is no abdominal tenderness.  Musculoskeletal:        General: No tenderness. Normal range of motion.     Cervical back: Normal range of motion and neck supple.  Lymphadenopathy:     Cervical: No cervical adenopathy.  Skin:    General: Skin is warm and dry.     Findings: Rash present. No erythema.     Comments: She has a blanching erythematous circular rash multiple lesions of different oval sized lesions on her trunk anteriorly as well as on her arms going down to the mid forearms and proximal thighs, there is no target lesions no vesicles no pustules no petechiae no purpura no lesions in the mouth no lesions on the palms or soles  Neurological:     Mental Status: She is alert.     Coordination: Coordination normal.  Psychiatric:        Behavior: Behavior normal.     (all labs ordered are listed, but only abnormal results are displayed) Labs Reviewed - No data to display  EKG: None  Radiology: No results found.   Procedures   Medications Ordered in the ED - No data to display                                  Medical Decision Making  I suspect the patient has pityriasis,  she has totally normal vital signs she had does not appear anaphylactic, she has had no other new exposures that would cause a topical dermatitis and this does not appear to be a dermatitis, rash is benign-appearing, will give course of prednisone  but suspect pityriasis and patient was given instructions to follow-up with her PCP this week.     Final diagnoses:  Pityriasis in adult    ED Discharge Orders          Ordered    predniSONE  (DELTASONE ) 20 MG tablet  Daily        04/05/24 1638               Cleotilde Rogue, MD 04/05/24 1638  "
